# Patient Record
Sex: Female | Born: 1965
Health system: Southern US, Community
[De-identification: ages and names within clinical notes are randomized; demographics above are authoritative.]

## PROBLEM LIST (undated history)

## (undated) DIAGNOSIS — R232 Flushing: Secondary | ICD-10-CM

## (undated) DIAGNOSIS — Z79890 Hormone replacement therapy (postmenopausal): Secondary | ICD-10-CM

## (undated) DIAGNOSIS — D219 Benign neoplasm of connective and other soft tissue, unspecified: Secondary | ICD-10-CM

## (undated) DIAGNOSIS — I10 Essential (primary) hypertension: Secondary | ICD-10-CM

## (undated) DIAGNOSIS — R9389 Abnormal findings on diagnostic imaging of other specified body structures: Secondary | ICD-10-CM

## (undated) DIAGNOSIS — E669 Obesity, unspecified: Secondary | ICD-10-CM

## (undated) DIAGNOSIS — N83209 Unspecified ovarian cyst, unspecified side: Secondary | ICD-10-CM

## (undated) DIAGNOSIS — N951 Menopausal and female climacteric states: Secondary | ICD-10-CM

## (undated) HISTORY — DX: Flushing: R23.2

## (undated) HISTORY — DX: Essential (primary) hypertension: I10

## (undated) HISTORY — DX: Obesity, unspecified: E66.9

## (undated) HISTORY — PX: TUBAL LIGATION: SHX77

## (undated) HISTORY — DX: Menopausal and female climacteric states: N95.1

## (undated) HISTORY — DX: Benign neoplasm of connective and other soft tissue, unspecified: D21.9

## (undated) HISTORY — DX: Abnormal findings on diagnostic imaging of other specified body structures: R93.89

## (undated) HISTORY — DX: Hormone replacement therapy: Z79.890

## (undated) HISTORY — DX: Unspecified ovarian cyst, unspecified side: N83.209

## (undated) HISTORY — PX: BREAST BIOPSY: SHX20

---

## 2014-07-15 ENCOUNTER — Other Ambulatory Visit: Payer: Self-pay | Admitting: Adult Health

## 2014-07-15 ENCOUNTER — Encounter: Payer: Self-pay | Admitting: Adult Health

## 2014-07-15 ENCOUNTER — Other Ambulatory Visit (HOSPITAL_COMMUNITY)
Admission: RE | Admit: 2014-07-15 | Discharge: 2014-07-15 | Disposition: A | Discharge status: Home / Self Care | Payer: BC Managed Care – PPO | Source: Ambulatory Visit | Attending: Adult Health | Admitting: Adult Health

## 2014-07-15 ENCOUNTER — Ambulatory Visit (INDEPENDENT_AMBULATORY_CARE_PROVIDER_SITE_OTHER): Payer: BC Managed Care – PPO | Admitting: Adult Health

## 2014-07-15 VITALS — BP 150/100 | HR 80 | Ht 66.0 in | Wt 257.0 lb

## 2014-07-15 DIAGNOSIS — Z01419 Encounter for gynecological examination (general) (routine) without abnormal findings: Secondary | ICD-10-CM

## 2014-07-15 DIAGNOSIS — N951 Menopausal and female climacteric states: Secondary | ICD-10-CM | POA: Insufficient documentation

## 2014-07-15 DIAGNOSIS — Z1151 Encounter for screening for human papillomavirus (HPV): Secondary | ICD-10-CM | POA: Diagnosis present

## 2014-07-15 DIAGNOSIS — I10 Essential (primary) hypertension: Secondary | ICD-10-CM

## 2014-07-15 DIAGNOSIS — Z113 Encounter for screening for infections with a predominantly sexual mode of transmission: Secondary | ICD-10-CM | POA: Insufficient documentation

## 2014-07-15 DIAGNOSIS — Z1231 Encounter for screening mammogram for malignant neoplasm of breast: Secondary | ICD-10-CM

## 2014-07-15 DIAGNOSIS — R232 Flushing: Secondary | ICD-10-CM | POA: Insufficient documentation

## 2014-07-15 DIAGNOSIS — Z1212 Encounter for screening for malignant neoplasm of rectum: Secondary | ICD-10-CM

## 2014-07-15 HISTORY — DX: Essential (primary) hypertension: I10

## 2014-07-15 HISTORY — DX: Menopausal and female climacteric states: N95.1

## 2014-07-15 HISTORY — DX: Flushing: R23.2

## 2014-07-15 LAB — COMPREHENSIVE METABOLIC PANEL
ALK PHOS: 52 U/L (ref 39–117)
ALT: 26 U/L (ref 0–35)
AST: 25 U/L (ref 0–37)
Albumin: 3.8 g/dL (ref 3.5–5.2)
BILIRUBIN TOTAL: 0.3 mg/dL (ref 0.2–1.2)
BUN: 15 mg/dL (ref 6–23)
CO2: 30 meq/L (ref 19–32)
CREATININE: 0.97 mg/dL (ref 0.50–1.10)
Calcium: 9.5 mg/dL (ref 8.4–10.5)
Chloride: 102 mEq/L (ref 96–112)
Glucose, Bld: 98 mg/dL (ref 70–99)
Potassium: 4.4 mEq/L (ref 3.5–5.3)
SODIUM: 140 meq/L (ref 135–145)
TOTAL PROTEIN: 7.1 g/dL (ref 6.0–8.3)

## 2014-07-15 LAB — LIPID PANEL
CHOL/HDL RATIO: 3.5 ratio
Cholesterol: 172 mg/dL (ref 0–200)
HDL: 49 mg/dL (ref >39)
LDL Cholesterol: 106 mg/dL — ABNORMAL HIGH (ref 0–99)
Triglycerides: 87 mg/dL (ref <150)
VLDL: 17 mg/dL (ref 0–40)

## 2014-07-15 LAB — CBC
HCT: 37.3 % (ref 36.0–46.0)
Hemoglobin: 12.8 g/dL (ref 12.0–15.0)
MCH: 29 pg (ref 26.0–34.0)
MCHC: 34.3 g/dL (ref 30.0–36.0)
MCV: 84.4 fL (ref 78.0–100.0)
MPV: 10.9 fL (ref 9.4–12.4)
Platelets: 296 10*3/uL (ref 150–400)
RBC: 4.42 MIL/uL (ref 3.87–5.11)
RDW: 14.7 % (ref 11.5–15.5)
WBC: 10.6 10*3/uL — ABNORMAL HIGH (ref 4.0–10.5)

## 2014-07-15 LAB — HEMOCCULT GUIAC POC 1CARD (OFFICE): Fecal Occult Blood, POC: NEGATIVE

## 2014-07-15 MED ORDER — HYDROCHLOROTHIAZIDE 12.5 MG PO CAPS: 12.5000 mg | ORAL_CAPSULE | Freq: Every day | ORAL | Status: DC

## 2014-07-15 NOTE — Progress Notes (Signed)
Patient ID: Shelby Wright, female   DOB: 26-Apr-1966, 48 y.o.   MRN: [id number] History of Present Illness: Shelby Wright is a 48 year old black female,single,new to this practice, in for a pap and physical.It has been 5-6 years since last exam.She complains of hot flashes and periods heavy one month then light the next,still having regular.   Current Medications, Allergies, Past Medical History, Past Surgical History, Family History and Social History were reviewed in [employer] record.     Review of Systems: Patient denies any daily headaches, blurred vision, shortness of breath, chest pain, abdominal pain, problems with bowel movements, urination, or intercourse.No joint pain or mood swings.She did see some rectal bleeding last week.     Physical Exam:BP 150/100 mmHg  Pulse 80  Ht 5\' 6"  (1.676 m)  Wt 257 lb (116.574 kg)  BMI 41.50 kg/m2  LMP 06/20/2014 General:  Well developed, well nourished, no acute distress Skin:  Warm and dry Neck:  Midline trachea, normal thyroid Lungs; Clear to auscultation bilaterally Breast:  No dominant palpable mass, retraction, or nipple discharge Cardiovascular: Regular rate and rhythm Abdomen:  Soft, non tender, no hepatosplenomegaly Pelvic:  External genitalia is normal in appearance.  The vagina is normal in appearance. The cervix is bulbous and smooth,pap with HPV and GC/CHL performed.  Uterus is felt to be normal size, shape, and contour.  No  adnexal masses or tenderness noted. Rectal: Good sphincter tone, no polyps, small interanal hemorrhoids felt.  Hemoccult negative.+rectocele Extremities:  No swelling or varicosities noted Psych:  No mood changes,alert and cooperative,seems happy,she is Freight forwarder at [employer] in [place] Discussed her BP,will try hydrochlorothiazide and decreasing salt, and menopause and HRT.  Impression: Well woman exam with Pap in new patient Peri menopause Hypertension Hot flashes    Plan: Check  CBC,CMP,TSH and lipids Mammogram scheduled for her for 12/7 at 9:30 at [facility] Rx hydrochlorothiazide 12.5 mg 1 daily #30 with 11 refills Return in 2 weeks to check BP and talk options for hot flashes Physical in 1 year Colonoscopy at 33 Review handouts on HRT,perimenopause and hypertension

## 2014-07-15 NOTE — Patient Instructions (Signed)
Perimenopause Perimenopause is the time when your body begins to move into the menopause (no menstrual period for 12 straight months). It is a natural process. Perimenopause can begin 2-8 years before the menopause and usually lasts for 1 year after the menopause. During this time, your ovaries may or may not produce an egg. The ovaries vary in their production of estrogen and progesterone hormones each month. This can cause irregular menstrual periods, difficulty getting pregnant, vaginal bleeding between periods, and uncomfortable symptoms. CAUSES  Irregular production of the ovarian hormones, estrogen and progesterone, and not ovulating every month.  Other causes include:  Tumor of the pituitary gland in the brain.  Medical disease that affects the ovaries.  Radiation treatment.  Chemotherapy.  Unknown causes.  Heavy smoking and excessive alcohol intake can bring on perimenopause sooner. SIGNS AND SYMPTOMS   Hot flashes.  Night sweats.  Irregular menstrual periods.  Decreased sex drive.  Vaginal dryness.  Headaches.  Mood swings.  Depression.  Memory problems.  Irritability.  Tiredness.  Weight gain.  Trouble getting pregnant.  The beginning of losing bone cells (osteoporosis).  The beginning of hardening of the arteries (atherosclerosis). DIAGNOSIS  Your health care provider will make a diagnosis by analyzing your age, menstrual history, and symptoms. He or she will do a physical exam and note any changes in your body, especially your female organs. Female hormone tests may or may not be helpful depending on the amount of female hormones you produce and when you produce them. However, other hormone tests may be helpful to rule out other problems. TREATMENT  In some cases, no treatment is needed. The decision on whether treatment is necessary during the perimenopause should be made by you and your health care provider based on how the symptoms are affecting you  and your lifestyle. Various treatments are available, such as:  Treating individual symptoms with a specific medicine for that symptom.  Herbal medicines that can help specific symptoms.  Counseling.  Group therapy. HOME CARE INSTRUCTIONS   Keep track of your menstrual periods (when they occur, how heavy they are, how long between periods, and how long they last) as well as your symptoms and when they started.  Only take over-the-counter or prescription medicines as directed by your health care provider.  Sleep and rest.  Exercise.  Eat a diet that contains calcium (good for your bones) and soy (acts like the estrogen hormone).  Do not smoke.  Avoid alcoholic beverages.  Take vitamin supplements as recommended by your health care provider. Taking vitamin E may help in certain cases.  Take calcium and vitamin D supplements to help prevent bone loss.  Group therapy is sometimes helpful.  Acupuncture may help in some cases. SEEK MEDICAL CARE IF:   You have questions about any symptoms you are having.  You need a referral to a specialist (gynecologist, psychiatrist, or psychologist). SEEK IMMEDIATE MEDICAL CARE IF:   You have vaginal bleeding.  Your period lasts longer than 8 days.  Your periods are recurring sooner than 21 days.  You have bleeding after intercourse.  You have severe depression.  You have pain when you urinate.  You have severe headaches.  You have vision problems. Document Released: 09/05/2004 Document Revised: 05/19/2013 Document Reviewed: 02/25/2013 [facility] Patient Information 2015 [place], [place]. This information is not intended to replace advice given to you by your health care provider. Make sure you discuss any questions you have with your health care provider. Hormone Therapy At menopause, your  body begins making less estrogen and progesterone hormones. This causes the body to stop having menstrual periods. This is because estrogen and  progesterone hormones control your periods and menstrual cycle. A lack of estrogen may cause symptoms such as:  Hot flushes (or hot flashes).  Vaginal dryness.  Dry skin.  Loss of sex drive.  Risk of bone loss (osteoporosis). When this happens, you may choose to take hormone therapy to get back the estrogen lost during menopause. When the hormone estrogen is given alone, it is usually referred to as ET (Estrogen Therapy). When the hormone progestin is combined with estrogen, it is generally called HT (Hormone Therapy). This was formerly known as hormone replacement therapy (HRT). Your caregiver can help you make a decision on what will be best for you. The decision to use HT seems to change often as new studies are done. Many studies do not agree on the benefits of hormone replacement therapy. LIKELY BENEFITS OF HT INCLUDE PROTECTION FROM:  Hot Flushes (also called hot flashes) - A hot flush is a sudden feeling of heat that spreads over the face and body. The skin may redden like a blush. It is connected with sweats and sleep disturbance. Women going through menopause may have hot flushes a few times a month or several times per day depending on the woman.  Osteoporosis (bone loss)- Estrogen helps guard against bone loss. After menopause, a woman's bones slowly lose calcium and become weak and brittle. As a result, bones are more likely to break. The hip, wrist, and spine are affected most often. Hormone therapy can help slow bone loss after menopause. Weight bearing exercise and taking calcium with vitamin D also can help prevent bone loss. There are also medications that your caregiver can prescribe that can help prevent osteoporosis.  Vaginal Dryness - Loss of estrogen causes changes in the vagina. Its lining may become thin and dry. These changes can cause pain and bleeding during sexual intercourse. Dryness can also lead to infections. This can cause burning and itching. (Vaginal estrogen  treatment can help relieve pain, itching, and dryness.)  Urinary Tract Infections are more common after menopause because of lack of estrogen. Some women also develop urinary incontinence because of low estrogen levels in the vagina and bladder.  Possible other benefits of estrogen include a positive effect on mood and short-term memory in women. RISKS AND COMPLICATIONS  Using estrogen alone without progesterone causes the lining of the uterus to grow. This increases the risk of lining of the uterus (endometrial) cancer. Your caregiver should give another hormone called progestin if you have a uterus.  Women who take combined (estrogen and progestin) HT appear to have an increased risk of breast cancer. The risk appears to be small, but increases throughout the time that HT is taken.  Combined therapy also makes the breast tissue slightly denser which makes it harder to read mammograms (breast X-rays).  Combined, estrogen and progesterone therapy can be taken together every day, in which case there may be spotting of blood. HT therapy can be taken cyclically in which case you will have menstrual periods. Cyclically means HT is taken for a set amount of days, then not taken, then this process is repeated.  HT may increase the risk of stroke, heart attack, breast cancer and forming blood clots in your leg.  Transdermal estrogen (estrogen that is absorbed through the skin with a patch or a cream) may have more positive results with:  Cholesterol.  Blood  pressure.  Blood clots. Having the following conditions may indicate you should not have HT:  Endometrial cancer.  Liver disease.  Breast cancer.  Heart disease.  History of blood clots.  Stroke. TREATMENT   If you choose to take HT and have a uterus, usually estrogen and progestin are prescribed.  Your caregiver will help you decide the best way to take the medications.  Possible ways to take estrogen  include:  Pills.  Patches.  Gels.  Sprays.  Vaginal estrogen cream, rings and tablets.  It is best to take the lowest dose possible that will help your symptoms and take them for the shortest period of time that you can.  Hormone therapy can help relieve some of the problems (symptoms) that affect women at menopause. Before making a decision about HT, talk to your caregiver about what is best for you. Be well informed and comfortable with your decisions. HOME CARE INSTRUCTIONS   Follow your caregivers advice when taking the medications.  A Pap test is done to screen for cervical cancer.  The first Pap test should be done at age 52.  Between ages 43 and 73, Pap tests are repeated every 2 years.  Beginning at age 64, you are advised to have a Pap test every 3 years as long as your past 3 Pap tests have been normal.  Some women have medical problems that increase the chance of getting cervical cancer. Talk to your caregiver about these problems. It is especially important to talk to your caregiver if a new problem develops soon after your last Pap test. In these cases, your caregiver may recommend more frequent screening and Pap tests.  The above recommendations are the same for women who have or have not gotten the vaccine for HPV (Human Papillomavirus).  If you had a hysterectomy for a problem that was not a cancer or a condition that could lead to cancer, then you no longer need Pap tests. However, even if you no longer need a Pap test, a regular exam is a good idea to make sure no other problems are starting.   If you are between ages 40 and 59, and you have had normal Pap tests going back 10 years, you no longer need Pap tests. However, even if you no longer need a Pap test, a regular exam is a good idea to make sure no other problems are starting.   If you have had past treatment for cervical cancer or a condition that could lead to cancer, you need Pap tests and screening  for cancer for at least 20 years after your treatment.  If Pap tests have been discontinued, risk factors (such as a new sexual partner) need to be re-assessed to determine if screening should be resumed.  Some women may need screenings more often if they are at high risk for cervical cancer.  Get mammograms done as per the advice of your caregiver. SEEK IMMEDIATE MEDICAL CARE IF:  You develop abnormal vaginal bleeding.  You have pain or swelling in your legs, shortness of breath, or chest pain.  You develop dizziness or headaches.  You have lumps or changes in your breasts or armpits.  You have slurred speech.  You develop weakness or numbness of your arms or legs.  You have pain, burning, or bleeding when urinating.  You develop abdominal pain. Document Released: 04/27/2003 Document Revised: 10/21/2011 Document Reviewed: 08/15/2010 [facility] Patient Information 2015 [place], [place]. This information is not intended to replace advice given  to you by your health care provider. Make sure you discuss any questions you have with your health care provider. Hypertension Hypertension, commonly called high blood pressure, is when the force of blood pumping through your arteries is too strong. Your arteries are the blood vessels that carry blood from your heart throughout your body. A blood pressure reading consists of a higher number over a lower number, such as 110/72. The higher number (systolic) is the pressure inside your arteries when your heart pumps. The lower number (diastolic) is the pressure inside your arteries when your heart relaxes. Ideally you want your blood pressure below 120/80. Hypertension forces your heart to work harder to pump blood. Your arteries may become narrow or stiff. Having hypertension puts you at risk for heart disease, stroke, and other problems.  RISK FACTORS Some risk factors for high blood pressure are controllable. Others are not.  Risk factors you  cannot control include:   Race. You may be at higher risk if you are African American.  Age. Risk increases with age.  Gender. Men are at higher risk than women before age 75 years. After age 39, women are at higher risk than men. Risk factors you can control include:  Not getting enough exercise or physical activity.  Being overweight.  Getting too much fat, sugar, calories, or salt in your diet.  Drinking too much alcohol. SIGNS AND SYMPTOMS Hypertension does not usually cause signs or symptoms. Extremely high blood pressure (hypertensive crisis) may cause headache, anxiety, shortness of breath, and nosebleed. DIAGNOSIS  To check if you have hypertension, your health care provider will measure your blood pressure while you are seated, with your arm held at the level of your heart. It should be measured at least twice using the same arm. Certain conditions can cause a difference in blood pressure between your right and left arms. A blood pressure reading that is higher than normal on one occasion does not mean that you need treatment. If one blood pressure reading is high, ask your health care provider about having it checked again. TREATMENT  Treating high blood pressure includes making lifestyle changes and possibly taking medicine. Living a healthy lifestyle can help lower high blood pressure. You may need to change some of your habits. Lifestyle changes may include:  Following the DASH diet. This diet is high in fruits, vegetables, and whole grains. It is low in salt, red meat, and added sugars.  Getting at least 2 hours of brisk physical activity every week.  Losing weight if necessary.  Not smoking.  Limiting alcoholic beverages.  Learning ways to reduce stress. If lifestyle changes are not enough to get your blood pressure under control, your health care provider may prescribe medicine. You may need to take more than one. Work closely with your health care provider to  understand the risks and benefits. HOME CARE INSTRUCTIONS  Have your blood pressure rechecked as directed by your health care provider.   Take medicines only as directed by your health care provider. Follow the directions carefully. Blood pressure medicines must be taken as prescribed. The medicine does not work as well when you skip doses. Skipping doses also puts you at risk for problems.   Do not smoke.   Monitor your blood pressure at home as directed by your health care provider. SEEK MEDICAL CARE IF:   You think you are having a reaction to medicines taken.  You have recurrent headaches or feel dizzy.  You have swelling in your  ankles.  You have trouble with your vision. SEEK IMMEDIATE MEDICAL CARE IF:  You develop a severe headache or confusion.  You have unusual weakness, numbness, or feel faint.  You have severe chest or abdominal pain.  You vomit repeatedly.  You have trouble breathing. MAKE SURE YOU:   Understand these instructions.  Will watch your condition.  Will get help right away if you are not doing well or get worse. Document Released: 07/29/2005 Document Revised: 12/13/2013 Document Reviewed: 05/21/2013 [facility] Patient Information 2015 [place], [place]. This information is not intended to replace advice given to you by your health care provider. Make sure you discuss any questions you have with your health care provider. Take BP meds Follow up in 2 weeks Mammogram 12/7 at 9:30 at [place] physical in 1 year

## 2014-07-16 LAB — TSH: TSH: 1.312 u[IU]/mL (ref 0.350–4.500)

## 2014-07-18 ENCOUNTER — Telehealth: Payer: Self-pay | Admitting: Adult Health

## 2014-07-18 ENCOUNTER — Ambulatory Visit (HOSPITAL_COMMUNITY)
Admission: RE | Admit: 2014-07-18 | Discharge: 2014-07-18 | Disposition: A | Discharge status: Home / Self Care | Payer: BC Managed Care – PPO | Source: Ambulatory Visit | Attending: Adult Health | Admitting: Adult Health

## 2014-07-18 DIAGNOSIS — R928 Other abnormal and inconclusive findings on diagnostic imaging of breast: Secondary | ICD-10-CM | POA: Insufficient documentation

## 2014-07-18 DIAGNOSIS — Z1231 Encounter for screening mammogram for malignant neoplasm of breast: Secondary | ICD-10-CM

## 2014-07-18 NOTE — Telephone Encounter (Signed)
Pt aware of labs and that they were good

## 2014-07-19 LAB — CYTOLOGY - PAP

## 2014-07-20 ENCOUNTER — Other Ambulatory Visit: Payer: Self-pay | Admitting: Adult Health

## 2014-07-20 DIAGNOSIS — R928 Other abnormal and inconclusive findings on diagnostic imaging of breast: Secondary | ICD-10-CM

## 2014-07-29 ENCOUNTER — Ambulatory Visit (INDEPENDENT_AMBULATORY_CARE_PROVIDER_SITE_OTHER): Payer: BC Managed Care – PPO | Admitting: Adult Health

## 2014-07-29 ENCOUNTER — Encounter: Payer: Self-pay | Admitting: Adult Health

## 2014-07-29 VITALS — BP 128/82 | Ht 66.0 in | Wt 256.0 lb

## 2014-07-29 DIAGNOSIS — N951 Menopausal and female climacteric states: Secondary | ICD-10-CM

## 2014-07-29 DIAGNOSIS — R232 Flushing: Secondary | ICD-10-CM

## 2014-07-29 DIAGNOSIS — I10 Essential (primary) hypertension: Secondary | ICD-10-CM

## 2014-07-29 DIAGNOSIS — Z79890 Hormone replacement therapy (postmenopausal): Secondary | ICD-10-CM

## 2014-07-29 HISTORY — DX: Hormone replacement therapy: Z79.890

## 2014-07-29 MED ORDER — ESTRADIOL-NORETHINDRONE ACET 0.05-0.14 MG/DAY TD PTTW: 1.0000 | MEDICATED_PATCH | TRANSDERMAL | Status: DC

## 2014-07-29 NOTE — Progress Notes (Signed)
Subjective:     Patient ID: Shelby Wright, female   DOB: 1966/04/16, 48 y.o.   MRN: [id number]  HPI Shelby Wright is a 48 year old black female in for BP check and to discuss hot flashes.  Review of Systems See HPI Reviewed past medical,surgical, social and family history. Reviewed medications and allergies.     Objective:   Physical Exam BP 128/82 mmHg  Ht 5\' 6"  (1.676 m)  Wt 256 lb (116.121 kg)  BMI 41.34 kg/m2  LMP 06/20/2014   Skin warm and dry.  Lungs: clear to ausculation bilaterally. Cardiovascular: regular rate and rhythm. BP much better and lost 1 lb. She is having bad hot flashes and wants something, discussed options and she wants to try HRT, will Rx Combipatch, she is aware of risks and benefits. She has F/U mammogram 09/02/14 for spot right breast.  Assessment:     Hypertension Hot flashes HRT Peri menopause    Plan:     Continue Microzide Decrease salt Increase exercise Rx Combipatch 0.05-0.14 1 patch biweekly with 11 refills Review handouts on HRT and Combipatch Follow up in 4 weeks

## 2014-07-29 NOTE — Patient Instructions (Signed)
Estradiol; Norethindrone skin patches What is this medicine? ESTRADIOL; NORETHINDRONE (es tra DYE ole; nor eth IN drone) contains a mixture of female hormones. This medicine helps to relieve the symptoms of menopause like hot flashes, night sweats, mood changes, and vaginal dryness and irritation. It is also used to treat women with low estrogen levels or those who have had their ovaries removed. This medicine may be used for other purposes; ask your health care provider or pharmacist if you have questions. COMMON BRAND NAME(S): CombiPatch What should I tell my health care provider before I take this medicine? They need to know if you have any of these conditions: -blood vessel disease or blood clots -breast, cervical, endometrial, or uterine cancer -diabetes -endometriosis -fibroids -gallbladder disease -heart disease or recent heart attack -high blood cholesterol -high blood pressure -high level of calcium in the blood -hysterectomy -kidney disease -liver disease -mental depression -migraine headaches -porphyria -stroke -systemic lupus erythematosus (SLE) -tobacco smoker -vaginal bleeding -an unusual or allergic reaction to estrogens, progestins, other medicines, foods, dyes, or preservatives -pregnant or trying to get pregnant -breast-feeding How should I use this medicine? This medicine is for external use only. Follow the directions on the prescription label. Use exactly as directed. Tear open the pouch, do not use scissors. Remove the stiff protective liner covering the adhesive. Try not to touch the adhesive. Apply the patch, sticky side to the skin, to an area of the lower abdomen that is clean, dry and hairless. Avoid injured, irritated, calloused, or scarred areas. Do not apply the skin patches to your breasts or around the waist area. Use a different site each time to prevent skin irritation. You should change your patch on the same days each week. Do not cut or trim the  patch. Do not stop using except on the advice of your doctor or health care professional. Talk to your pediatrician regarding the use of this medicine in children. Special care may be needed. A patient package insert for the product will be given with each prescription and refill. Read this sheet carefully each time. The sheet may change frequently. Overdosage: If you think you have taken too much of this medicine contact a poison control center or emergency room at once. NOTE: This medicine is only for you. Do not share this medicine with others. What if I miss a dose? If you forget to change your patch as scheduled, apply it as soon as possible. Remember to remove the old patch. If it is almost time to apply the next patch, skip the missed patch and get back on your normal schedule. Do not wear more than one patch at a time unless you are told to do so by your doctor or health care professional. What may interact with this medicine? Do not take this medicine with any of the following medications: -aromatase inhibitors like aminoglutethimide, anastrozole, exemestane, letrozole, testolactone This medicine may also interact with the following medications: -barbiturates, such as phenobarbital -benzodiazepines -bosentan -bromocriptine -carbamazepine -cimetidine -cyclosporine -dantrolene -grapefruit juice -griseofulvin -hydrocortisone, cortisone, or prednisolone -isoniazid (INH) -medications for diabetes -methotrexate -mineral oil -phenytoin -raloxifene -rifabutin, rifampin, or rifapentine -tamoxifen -thyroid hormones -topiramate -tricyclic antidepressants -warfarin This list may not describe all possible interactions. Give your health care provider a list of all the medicines, herbs, non-prescription drugs, or dietary supplements you use. Also tell them if you smoke, drink alcohol, or use illegal drugs. Some items may interact with your medicine. What should I watch for while using  this medicine? Visit  your health care professional for regular checks on your progress. You should have a complete check-up every 6 months. You will need a regular breast and pelvic exam. You should also discuss the need for regular mammograms with your health care professional, and follow his or her guidelines. This medicine can make your body retain fluid, making your fingers, hands, or ankles swell. Your blood pressure can go up. Contact your doctor or health care professional if you feel you are retaining fluid. If you have any reason to think you are pregnant; stop taking this medicine at once and contact your doctor or health care professional. Tobacco smoking increases the risk of getting a blood clot or having a stroke, especially if you are more than 49 years old. You are strongly advised not to smoke. If you wear contact lenses and notice visual changes, or if the lenses begin to feel uncomfortable, consult your eye care specialist. If you are going to have elective surgery, you may need to stop taking this medicine beforehand. Consult your health care professional for advice prior to scheduling the surgery. If you are going to have a MRI procedure, let your MRI technician know about the use of these patches. Some drug patches contain an aluminized backing that can become heated when exposed to MRI and may cause burns. You may need to temporarily remove the patch during the MRI procedure. You may bathe or participate in other activities while wearing your patch. If the patch pulls loose or falls off, you may reapply it if the patch is sticky enough to stay on the skin. You should reapply the patch in a different area. Otherwise use a fresh patch. What side effects may I notice from receiving this medicine? Side effects that you should report to your doctor or health care professional as soon as possible: -allergic reactions like skin rash, itching or hives, swelling of the face, lips, or  tongue -breast tissue changes or discharge -changes in vision -chest pain -confusion, trouble speaking or understanding -dark urine -general ill feeling or flu-like symptoms -light-colored stools -nausea, vomiting -pain, swelling, warmth in the leg -right upper belly pain -severe headaches -shortness of breath -sudden numbness or weakness of the face, arm or leg -trouble walking, dizziness, loss of balance or coordination -unusual vaginal bleeding -yellowing of the eyes or skin Side effects that usually do not require medical attention (report to your doctor or health care professional if they continue or are bothersome): -acne -brown spots on the face -change in appetite -change in sexual desire -depressed mood or mood swings -fluid retention and swelling -stomach cramps or bloating -unusually weak or tired -weight gain This list may not describe all possible side effects. Call your doctor for medical advice about side effects. You may report side effects to FDA at 1-800-FDA-1088. Where should I keep my medicine? Keep out of the reach of children. Store at room temperature between 15 and 30 degrees C (59 and 86 degrees F) in the sealed foil pouch. Throw away any unused medicine after 6 months or the expiration date on the package, whichever is sooner. NOTE: This sheet is a summary. It may not cover all possible information. If you have questions about this medicine, talk to your doctor, pharmacist, or health care provider.  2015, [employer]. (2008-07-14 14:04:37) Hormone Therapy At menopause, your body begins making less estrogen and progesterone hormones. This causes the body to stop having menstrual periods. This is because estrogen and progesterone hormones control your periods  and menstrual cycle. A lack of estrogen may cause symptoms such as:  Hot flushes (or hot flashes).  Vaginal dryness.  Dry skin.  Loss of sex drive.  Risk of bone loss  (osteoporosis). When this happens, you may choose to take hormone therapy to get back the estrogen lost during menopause. When the hormone estrogen is given alone, it is usually referred to as ET (Estrogen Therapy). When the hormone progestin is combined with estrogen, it is generally called HT (Hormone Therapy). This was formerly known as hormone replacement therapy (HRT). Your caregiver can help you make a decision on what will be best for you. The decision to use HT seems to change often as new studies are done. Many studies do not agree on the benefits of hormone replacement therapy. LIKELY BENEFITS OF HT INCLUDE PROTECTION FROM:  Hot Flushes (also called hot flashes) - A hot flush is a sudden feeling of heat that spreads over the face and body. The skin may redden like a blush. It is connected with sweats and sleep disturbance. Women going through menopause may have hot flushes a few times a month or several times per day depending on the woman.  Osteoporosis (bone loss)- Estrogen helps guard against bone loss. After menopause, a woman's bones slowly lose calcium and become weak and brittle. As a result, bones are more likely to break. The hip, wrist, and spine are affected most often. Hormone therapy can help slow bone loss after menopause. Weight bearing exercise and taking calcium with vitamin D also can help prevent bone loss. There are also medications that your caregiver can prescribe that can help prevent osteoporosis.  Vaginal Dryness - Loss of estrogen causes changes in the vagina. Its lining may become thin and dry. These changes can cause pain and bleeding during sexual intercourse. Dryness can also lead to infections. This can cause burning and itching. (Vaginal estrogen treatment can help relieve pain, itching, and dryness.)  Urinary Tract Infections are more common after menopause because of lack of estrogen. Some women also develop urinary incontinence because of low estrogen levels in  the vagina and bladder.  Possible other benefits of estrogen include a positive effect on mood and short-term memory in women. RISKS AND COMPLICATIONS  Using estrogen alone without progesterone causes the lining of the uterus to grow. This increases the risk of lining of the uterus (endometrial) cancer. Your caregiver should give another hormone called progestin if you have a uterus.  Women who take combined (estrogen and progestin) HT appear to have an increased risk of breast cancer. The risk appears to be small, but increases throughout the time that HT is taken.  Combined therapy also makes the breast tissue slightly denser which makes it harder to read mammograms (breast X-rays).  Combined, estrogen and progesterone therapy can be taken together every day, in which case there may be spotting of blood. HT therapy can be taken cyclically in which case you will have menstrual periods. Cyclically means HT is taken for a set amount of days, then not taken, then this process is repeated.  HT may increase the risk of stroke, heart attack, breast cancer and forming blood clots in your leg.  Transdermal estrogen (estrogen that is absorbed through the skin with a patch or a cream) may have more positive results with:  Cholesterol.  Blood pressure.  Blood clots. Having the following conditions may indicate you should not have HT:  Endometrial cancer.  Liver disease.  Breast cancer.  Heart disease.  History of blood clots.  Stroke. TREATMENT   If you choose to take HT and have a uterus, usually estrogen and progestin are prescribed.  Your caregiver will help you decide the best way to take the medications.  Possible ways to take estrogen include:  Pills.  Patches.  Gels.  Sprays.  Vaginal estrogen cream, rings and tablets.  It is best to take the lowest dose possible that will help your symptoms and take them for the shortest period of time that you can.  Hormone  therapy can help relieve some of the problems (symptoms) that affect women at menopause. Before making a decision about HT, talk to your caregiver about what is best for you. Be well informed and comfortable with your decisions. HOME CARE INSTRUCTIONS   Follow your caregivers advice when taking the medications.  A Pap test is done to screen for cervical cancer.  The first Pap test should be done at age 27.  Between ages 30 and 100, Pap tests are repeated every 2 years.  Beginning at age 16, you are advised to have a Pap test every 3 years as long as your past 3 Pap tests have been normal.  Some women have medical problems that increase the chance of getting cervical cancer. Talk to your caregiver about these problems. It is especially important to talk to your caregiver if a new problem develops soon after your last Pap test. In these cases, your caregiver may recommend more frequent screening and Pap tests.  The above recommendations are the same for women who have or have not gotten the vaccine for HPV (Human Papillomavirus).  If you had a hysterectomy for a problem that was not a cancer or a condition that could lead to cancer, then you no longer need Pap tests. However, even if you no longer need a Pap test, a regular exam is a good idea to make sure no other problems are starting.   If you are between ages 56 and 63, and you have had normal Pap tests going back 10 years, you no longer need Pap tests. However, even if you no longer need a Pap test, a regular exam is a good idea to make sure no other problems are starting.   If you have had past treatment for cervical cancer or a condition that could lead to cancer, you need Pap tests and screening for cancer for at least 20 years after your treatment.  If Pap tests have been discontinued, risk factors (such as a new sexual partner) need to be re-assessed to determine if screening should be resumed.  Some women may need screenings  more often if they are at high risk for cervical cancer.  Get mammograms done as per the advice of your caregiver. SEEK IMMEDIATE MEDICAL CARE IF:  You develop abnormal vaginal bleeding.  You have pain or swelling in your legs, shortness of breath, or chest pain.  You develop dizziness or headaches.  You have lumps or changes in your breasts or armpits.  You have slurred speech.  You develop weakness or numbness of your arms or legs.  You have pain, burning, or bleeding when urinating.  You develop abdominal pain. Document Released: 04/27/2003 Document Revised: 10/21/2011 Document Reviewed: 08/15/2010 [facility] Patient Information 2015 [place], [place]. This information is not intended to replace advice given to you by your health care provider. Make sure you discuss any questions you have with your health care provider. Continue BP meds Increase activity Decrease salt Try  the patch  Follow up in 4 weeks

## 2014-08-02 ENCOUNTER — Ambulatory Visit (HOSPITAL_COMMUNITY)
Admission: RE | Admit: 2014-08-02 | Discharge: 2014-08-02 | Disposition: A | Discharge status: Home / Self Care | Payer: BC Managed Care – PPO | Source: Ambulatory Visit | Attending: Adult Health | Admitting: Adult Health

## 2014-08-02 ENCOUNTER — Other Ambulatory Visit: Payer: Self-pay | Admitting: Adult Health

## 2014-08-02 DIAGNOSIS — Z1231 Encounter for screening mammogram for malignant neoplasm of breast: Secondary | ICD-10-CM | POA: Diagnosis not present

## 2014-08-02 DIAGNOSIS — R928 Other abnormal and inconclusive findings on diagnostic imaging of breast: Secondary | ICD-10-CM

## 2014-08-02 DIAGNOSIS — R229 Localized swelling, mass and lump, unspecified: Principal | ICD-10-CM

## 2014-08-02 DIAGNOSIS — N63 Unspecified lump in breast: Secondary | ICD-10-CM | POA: Insufficient documentation

## 2014-08-02 DIAGNOSIS — N6310 Unspecified lump in the right breast, unspecified quadrant: Secondary | ICD-10-CM

## 2014-08-02 DIAGNOSIS — IMO0002 Reserved for concepts with insufficient information to code with codable children: Secondary | ICD-10-CM

## 2014-08-03 ENCOUNTER — Other Ambulatory Visit: Payer: Self-pay | Admitting: Adult Health

## 2014-08-03 DIAGNOSIS — R928 Other abnormal and inconclusive findings on diagnostic imaging of breast: Secondary | ICD-10-CM

## 2014-08-08 ENCOUNTER — Telehealth: Payer: Self-pay | Admitting: Adult Health

## 2014-08-08 NOTE — Telephone Encounter (Signed)
Spoke with pt. Pt states the Combipatch is to expensive. Pt states she needs something for hot flashes. Can you prescribe something different? Pt is having a right breast biopsy tomorrow at 9am. I advised pt you wasn't in the office today, but would be back tomorrow. Pt voiced understanding. [place]

## 2014-08-09 ENCOUNTER — Other Ambulatory Visit: Payer: Self-pay | Admitting: Adult Health

## 2014-08-09 ENCOUNTER — Ambulatory Visit (HOSPITAL_COMMUNITY)
Admission: RE | Admit: 2014-08-09 | Discharge: 2014-08-09 | Disposition: A | Discharge status: Home / Self Care | Payer: BC Managed Care – PPO | Source: Ambulatory Visit | Attending: Adult Health | Admitting: Adult Health

## 2014-08-09 ENCOUNTER — Ambulatory Visit (HOSPITAL_COMMUNITY): Payer: BC Managed Care – PPO

## 2014-08-09 DIAGNOSIS — R928 Other abnormal and inconclusive findings on diagnostic imaging of breast: Secondary | ICD-10-CM

## 2014-08-09 DIAGNOSIS — Z1239 Encounter for other screening for malignant neoplasm of breast: Secondary | ICD-10-CM | POA: Diagnosis not present

## 2014-08-09 DIAGNOSIS — N63 Unspecified lump in breast: Secondary | ICD-10-CM | POA: Diagnosis present

## 2014-08-09 MED ORDER — LIDOCAINE HCL (PF) 2 % IJ SOLN
INTRAMUSCULAR | Status: AC
  Administered 2014-08-09: 10 mL
  Filled 2014-08-09: qty 10

## 2014-08-09 NOTE — Telephone Encounter (Signed)
Pt had breast biopsy today, will await results before prescribing any more HRT, if results normal will rx then, has not gotten combipatch due to cost

## 2014-08-09 NOTE — Procedures (Signed)
Procedure Note Pre-operative diagnosis: Mass right breast 2:30  Post-operative diagnosis: Same  Procedure: ultrasound guided core biopsy of mass right breast 2:30  Physician:Lain Tetterton Owens Shark, M.D. Assistant: None Anesthesia: local  EBL: Minimal Findings: Preprocedure ultrasound confirms mass Specimens:Core samples from the right breast 2:30 oc mass Disposition of the specimen:Tissue to pathology. Complications: None immediately apparent. Disposition of the patient:to home in good condition.  Initial ultrasound is performed, confirming the previous findings.  Following a discussion of risks and benefits, informed consent was obtained.   Using ultrasound guidance, a 12 gauge vacuum assisted needle, sterile technique, and 1-2% lidocaine local anesthetic, biopsy is performed of the mass in the 2:30 location. The patient tolerated the procedure well.  She is given post-procedure instructions, and and is discharged to home in good condition.

## 2014-08-09 NOTE — Discharge Instructions (Signed)
Breast Biopsy °Care After °These instructions give you information on caring for yourself after your procedure. Your doctor may also give you more specific instructions. Call your doctor if you have any problems or questions after your procedure. °HOME CARE °· Only take medicine as told by your doctor. °· Do not take aspirin. °· Keep your sutures (stitches) dry when bathing. °· Protect the biopsy area. Do not let the area get bumped. °· Avoid activities that could pull the biopsy site open until your doctor approves. This includes: °· Stretching. °· Reaching. °· Exercise. °· Sports. °· Lifting more than 3lb. °· Continue your normal diet. °· Wear a good support bra for as long as told by your doctor. °· Change any bandages (dressings) as told by your doctor. °· Do not drink alcohol while taking pain medicine. °· Keep all doctor visits as told. Ask when your test results will be ready. Make sure you get your test results. °GET HELP RIGHT AWAY IF:  °· You have a fever. °· You have more bleeding (more than a small spot) from the biopsy site. °· You have trouble breathing. °· You have yellowish-white fluid (pus) coming from the biopsy site. °· You have redness, puffiness (swelling), or more pain in the biopsy site. °· You have a bad smell coming from the biopsy site. °· Your biopsy site opens after sutures, staples, or sticky strips have been removed. °· You have a rash. °· You need stronger medicine. °MAKE SURE YOU: °· Understand these instructions. °· Will watch your condition. °· Will get help right away if you are not doing well or get worse. °Document Released: 05/25/2009 Document Revised: 10/21/2011 Document Reviewed: 09/08/2011 °[employer]® Patient Information ©2015 [employer]. This information is not intended to replace advice given to you by your health care provider. Make sure you discuss any questions you have with your health care provider. ° °Breast Biopsy °A breast biopsy is a test during which a sample of  tissue is taken from your breast. The breast tissue is looked at under a microscope for cancer cells.  °BEFORE THE PROCEDURE °· Make plans to have someone drive you home after the test. °· Do not smoke for 2 weeks before the test. Stop smoking, if you smoke. °· Do not drink alcohol for 24 hours before the test. °· Wear a good support bra to the test. °PROCEDURE  °You may be given one of the following: °· A medicine to numb the breast area (local anesthetic). °· A medicine to make you fall asleep (general anesthetic). °There are different types of breast biopsies. They include: °· Fine-needle aspiration. °¨ A needle is put into the breast lump. °¨ The needle takes out fluid and cells from the lump. °¨ Ultrasound imaging may be used to help find the lump and to put the needle in the right spot. °· Core-needle biopsy. °¨ A needle is put into the breast lump. °¨ The needle is put in your breast 3-6 times. °¨ The needle removes breast tissue. °¨ An ultrasound image or X-ray is often used to find the right spot to put in the needle. °· Stereotactic biopsy. °¨ X-rays and a computer are used to study X-ray pictures of the breast lump. °¨ The computer finds where the needle needs to be put into the breast. °¨ Tissue samples are taken out. °· Vacuum-assisted biopsy. °¨ A small cut (incision) is made in your breast. °¨ A biopsy device is put through the cut and into the breast   tissue. °¨ The biopsy device draws abnormal breast tissue into the biopsy device. °¨ A large tissue sample is often removed. °¨ No stitches are needed. °· Ultrasound-guided core-needle biopsy. °¨ Ultrasound imaging helps guide the needle into the area of the breast that is not normal. °¨ A cut is made in the breast. The needle is put into the breast lump. °¨ Tissue samples are taken out. °· Open biopsy. °¨ A large cut is made in the breast. °¨ Your doctor will try to remove the whole breast lump or as much as possible. °All tissue, fluid, or cell samples  are looked at under a microscope.  °AFTER THE PROCEDURE °· You will be taken to an area to recover. You will be able to go home once you are doing well and are without problems. °· You may have bruising on your breast. This is normal. °· A pressure bandage (dressing) may be put on your breast for 24-48 hours. This type of bandage is wrapped tightly around your chest. It helps stop fluid from building up underneath tissues. °Document Released: 10/21/2011 Document Revised: 12/13/2013 Document Reviewed: 10/21/2011 °ExitCare® Patient Information ©2015 [employer]. This information is not intended to replace advice given to you by your health care provider. Make sure you discuss any questions you have with your health care provider. ° °

## 2014-08-11 ENCOUNTER — Telehealth: Payer: Self-pay | Admitting: Adult Health

## 2014-08-11 MED ORDER — ESTRADIOL-NORETHINDRONE ACET 1-0.5 MG PO TABS: 1.0000 | ORAL_TABLET | Freq: Every day | ORAL | Status: DC

## 2014-08-11 NOTE — Telephone Encounter (Signed)
Will rx activella for hot flashes pt aware of breast biopsy results

## 2014-08-25 ENCOUNTER — Ambulatory Visit (INDEPENDENT_AMBULATORY_CARE_PROVIDER_SITE_OTHER): Payer: BLUE CROSS/BLUE SHIELD | Admitting: Adult Health

## 2014-08-25 ENCOUNTER — Encounter: Payer: Self-pay | Admitting: Adult Health

## 2014-08-25 VITALS — BP 140/70 | Ht 66.0 in | Wt 258.0 lb

## 2014-08-25 DIAGNOSIS — R232 Flushing: Secondary | ICD-10-CM

## 2014-08-25 DIAGNOSIS — N951 Menopausal and female climacteric states: Secondary | ICD-10-CM

## 2014-08-25 DIAGNOSIS — I10 Essential (primary) hypertension: Secondary | ICD-10-CM

## 2014-08-25 NOTE — Progress Notes (Signed)
Subjective:     Patient ID: Shelby Wright, female   DOB: 1965/12/01, 49 y.o.   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  Review of Systems See HPI Reviewed past medical,surgical, social and family history. Reviewed medications and allergies.     Objective:   Physical Exam BP 140/70 mmHg  Ht 5\' 6"  (1.676 m)  Wt 258 lb (117.028 kg)  BMI 41.66 kg/m2  LMP 07/09/2014   Skin: Warm and dry Lungs: Clear to auscultation bilaterally. Cardiovascular: Regular rate and rhythm. Will just see how hot flashes go for now, discussed SSRI as other option, or can not take anything. Discussed try to lose about 25 lbs to help with BP.  Assessment:     Hypertension Peri menopausal Hot flashes    Plan:     Continue microzide Try Whole 30, for weight loss and health Return in 3 months for BP check and weight check

## 2014-08-25 NOTE — Patient Instructions (Signed)
Try Whole 30 Continue BP meds  Return in 3 months

## 2015-02-03 ENCOUNTER — Other Ambulatory Visit: Payer: Self-pay | Admitting: Preventative Medicine

## 2015-02-03 DIAGNOSIS — M545 Low back pain: Secondary | ICD-10-CM

## 2015-02-08 ENCOUNTER — Ambulatory Visit
Admission: RE | Admit: 2015-02-08 | Discharge: 2015-02-08 | Disposition: A | Discharge status: Home / Self Care | Payer: Worker's Compensation | Source: Ambulatory Visit | Attending: Preventative Medicine | Admitting: Preventative Medicine

## 2015-02-08 DIAGNOSIS — M545 Low back pain: Secondary | ICD-10-CM

## 2015-11-05 ENCOUNTER — Emergency Department (HOSPITAL_COMMUNITY)
Admission: EM | Admit: 2015-11-05 | Discharge: 2015-11-05 | Disposition: A | Discharge status: Home / Self Care | Payer: BLUE CROSS/BLUE SHIELD | Attending: Emergency Medicine | Admitting: Emergency Medicine

## 2015-11-05 ENCOUNTER — Emergency Department (HOSPITAL_COMMUNITY): Payer: BLUE CROSS/BLUE SHIELD

## 2015-11-05 ENCOUNTER — Encounter (HOSPITAL_COMMUNITY): Payer: Self-pay | Admitting: Emergency Medicine

## 2015-11-05 DIAGNOSIS — E669 Obesity, unspecified: Secondary | ICD-10-CM | POA: Diagnosis not present

## 2015-11-05 DIAGNOSIS — Y9301 Activity, walking, marching and hiking: Secondary | ICD-10-CM | POA: Diagnosis not present

## 2015-11-05 DIAGNOSIS — S90122A Contusion of left lesser toe(s) without damage to nail, initial encounter: Secondary | ICD-10-CM | POA: Diagnosis not present

## 2015-11-05 DIAGNOSIS — S99922A Unspecified injury of left foot, initial encounter: Secondary | ICD-10-CM | POA: Diagnosis present

## 2015-11-05 DIAGNOSIS — Y929 Unspecified place or not applicable: Secondary | ICD-10-CM | POA: Insufficient documentation

## 2015-11-05 DIAGNOSIS — W2203XA Walked into furniture, initial encounter: Secondary | ICD-10-CM | POA: Diagnosis not present

## 2015-11-05 DIAGNOSIS — I10 Essential (primary) hypertension: Secondary | ICD-10-CM | POA: Insufficient documentation

## 2015-11-05 DIAGNOSIS — Y999 Unspecified external cause status: Secondary | ICD-10-CM | POA: Insufficient documentation

## 2015-11-05 MED ORDER — TRAMADOL HCL 50 MG PO TABS: 50.0000 mg | ORAL_TABLET | Freq: Four times a day (QID) | ORAL | Status: DC | PRN

## 2015-11-05 MED ORDER — NAPROXEN 500 MG PO TABS: 500.0000 mg | ORAL_TABLET | Freq: Two times a day (BID) | ORAL | Status: DC

## 2015-11-05 NOTE — ED Notes (Signed)
Pt states she was speedwalking and "jammed" her left 2nd toe a week ago.

## 2015-11-05 NOTE — ED Provider Notes (Signed)
CSN: [id number]     Arrival date & time 11/05/15  1531 History  By signing my name below, I, Shelby Wright, attest that this documentation has been prepared under the direction and in the presence of [name], PA-C. Electronically Signed: [name], ED Scribe. 11/05/2015. 4:56 PM.     Chief Complaint  Patient presents with  . Toe Pain   The history is provided by the patient. No language interpreter was used.   HPI Comments: Shelby Wright is a 50 y.o. female who presents to the Emergency Department complaining of moderate, throbbing  left 4th toe pain and swelling onset a week ago s/p injury. Pt states she accidentally walked into her couch, causing her injury. Denies fall, LOC or head injury. She states pain and swelling is worsened with movement, ambulation and weight bearing. She is ambulatory, but with pain. She reports no relief with ice application. Pt denies taking OTC medications at home to improve symptoms. NKDA. Denies numbness, additional injuries, redness, or open wounds.   Past Medical History  Diagnosis Date  . Obesity   . Peri-menopause 07/15/2014  . Hot flashes 07/15/2014  . Hypertension 07/15/2014  . Hormone replacement therapy (HRT) 07/29/2014   Past Surgical History  Procedure Laterality Date  . Cesarean section    . Tubal ligation    . Breast biopsy Right    Family History  Problem Relation Age of Onset  . Hypertension Mother   . Diabetes Father   . Cancer Maternal Grandmother    Social History  Substance Use Topics  . Smoking status: Never Smoker   . Smokeless tobacco: Never Used  . Alcohol Use: No   OB History    Gravida Para Term Preterm AB TAB SAB Ectopic Multiple Living   1 1        1      Review of Systems  Musculoskeletal: Positive for joint swelling and arthralgias.  Skin: Negative for wound.  Neurological: Negative for weakness and numbness.  All other systems reviewed and are negative.  Allergies  Review of patient's allergies  indicates no known allergies.  Home Medications   Prior to Admission medications   Medication Sig Start Date End Date Taking? Authorizing Provider  estradiol-norethindrone (ACTIVELLA) 1-0.5 MG per tablet Take 1 tablet by mouth daily. Patient not taking: Reported on 08/25/2014 08/11/14   Estill Dooms, NP  hydrochlorothiazide (MICROZIDE) 12.5 MG capsule Take 1 capsule (12.5 mg total) by mouth daily. 07/15/14   Estill Dooms, NP   BP 161/76 mmHg  Pulse 85  Temp(Src) 98.1 F (36.7 C) (Oral)  Resp 18  Ht 5\' 6"  (1.676 m)  Wt 260 lb (117.935 kg)  BMI 41.99 kg/m2  SpO2 100%  LMP 07/09/2014 Physical Exam  Constitutional: She is oriented to person, place, and time. She appears well-developed and well-nourished.  HENT:  Head: Normocephalic and atraumatic.  Eyes: Conjunctivae and EOM are normal. Pupils are equal, round, and reactive to light.  Neck: Normal range of motion. Neck supple.  Cardiovascular: Normal rate, regular rhythm and normal heart sounds.  Exam reveals no gallop and no friction rub.   No murmur heard. Pulmonary/Chest: Effort normal and breath sounds normal. No respiratory distress. She has no wheezes. She has no rales.  Abdominal: She exhibits no distension.  Musculoskeletal: Normal range of motion. She exhibits tenderness.  Tenderness and edema of the left 4th toe. No bony deformities or open wounds. Sensation intact distally. Capillary refill less than 3 seconds.  Able to  flex and extend toe. Pt is ambulatory.   Neurological: She is alert and oriented to person, place, and time.  Skin: Skin is warm and dry.  Psychiatric: She has a normal mood and affect. Her behavior is normal.  Nursing note and vitals reviewed.   ED Course  Procedures (including critical care time) DIAGNOSTIC STUDIES: Oxygen Saturation is 100% on RA, normal by my interpretation.    COORDINATION OF CARE: 4:53 PM Discussed treatment plan with pt at bedside which includes XR, podiatry follow  up and pt agreed to plan.   Imaging Review Dg Foot Complete Left  11/05/2015  CLINICAL DATA:  Chain second toe while speed walking, initial encounter EXAM: LEFT FOOT - COMPLETE 3+ VIEW COMPARISON:  None. FINDINGS: Hallux valgus deformity is noted. Prominent soft tissues noted adjacent to the head of the first metatarsal which may represent a gouty deposits. Regularity of the fourth proximal phalanx is noted which may be related to prior trauma and healing. No definitive fracture is seen. No other acute abnormality is noted. Calcaneal spurs are seen. IMPRESSION: Chronic appearing changes as described.  No acute abnormality noted. Electronically Signed   By: Inez Catalina M.D.   On: 11/05/2015 16:00   I have personally reviewed and evaluated these images as part of my medical decision-making.   MDM   Final diagnoses:  Contusion, toe, left, initial encounter   Focal tenderness of the fourth toe.  No fx on XR.  NV intact.  Toes buddy taped.  Post op shoe.  Podiatry referral given.    I personally performed the services described in this documentation, which was scribed in my presence. The recorded information has been reviewed and is accurate.   Kem Parkinson, PA-C 11/07/15 [id number]  Davonna Belling, MD 11/07/15 [phone]

## 2016-06-25 IMAGING — MG MM DIGITAL DIAGNOSTIC UNILAT R
4 series · 4 of 4 positions shown · non-contrast
Comparison: 07/18/2014.

CLINICAL DATA: Recall from screening mammogram.

EXAM:
DIGITAL DIAGNOSTIC  right breast MAMMOGRAM
ULTRASOUND right BREAST

[R CC (1 of 2)]
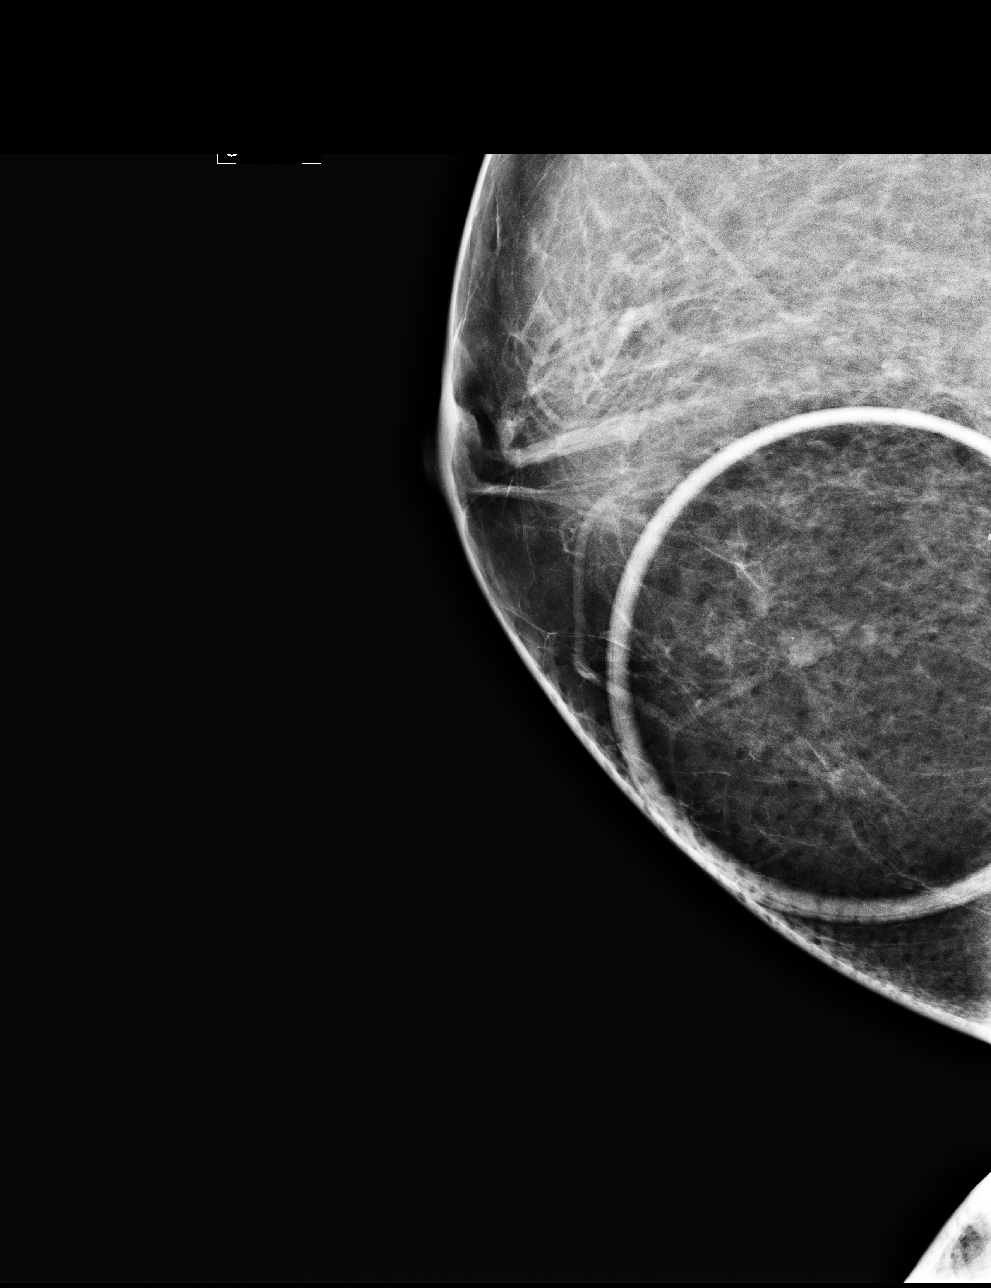

[R MLO (1 of 2)]
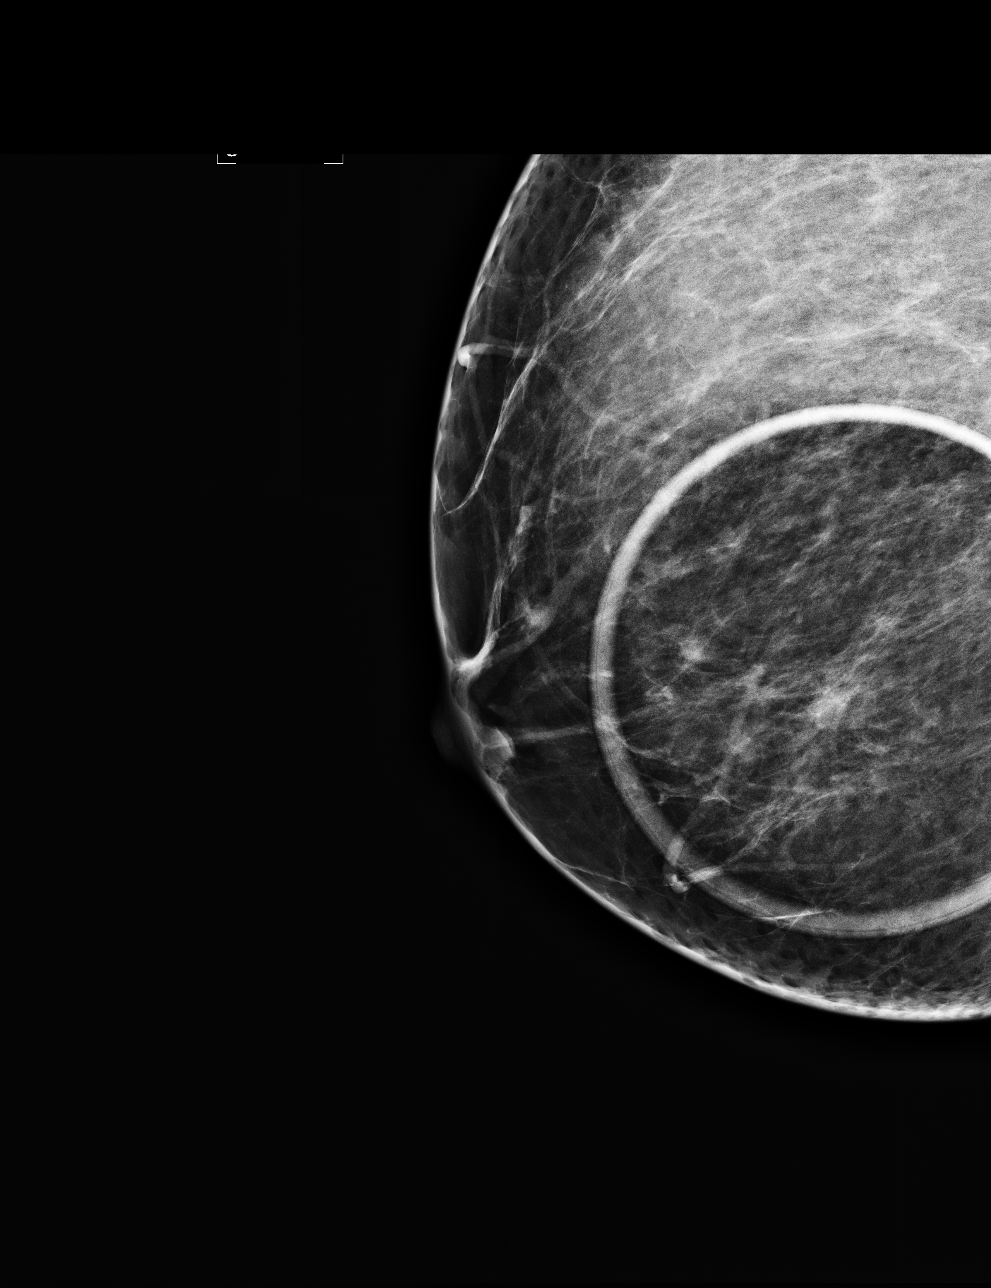

[R CC (2 of 2)]
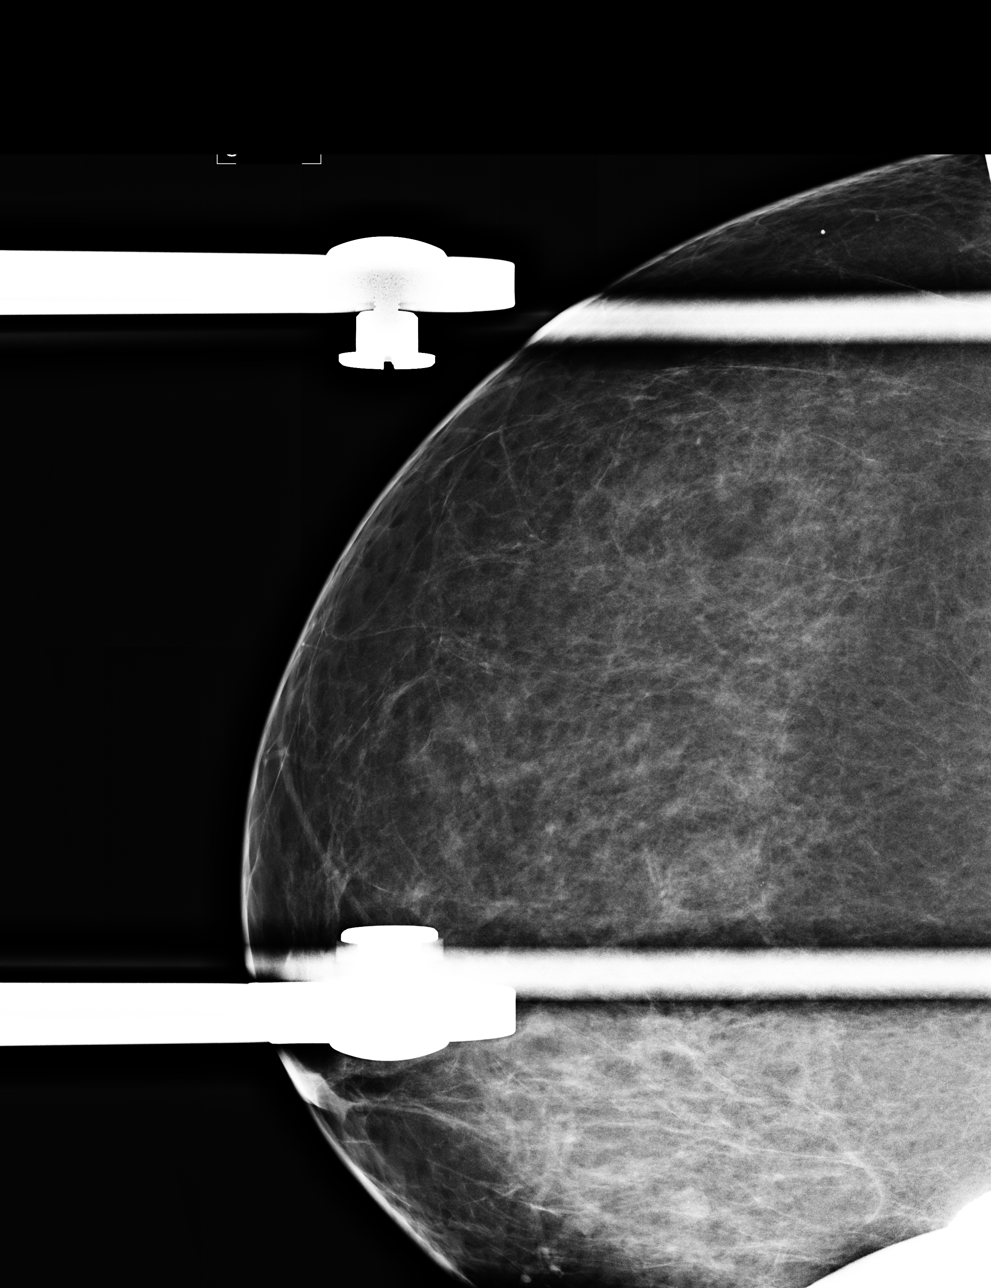

[R MLO (2 of 2)]
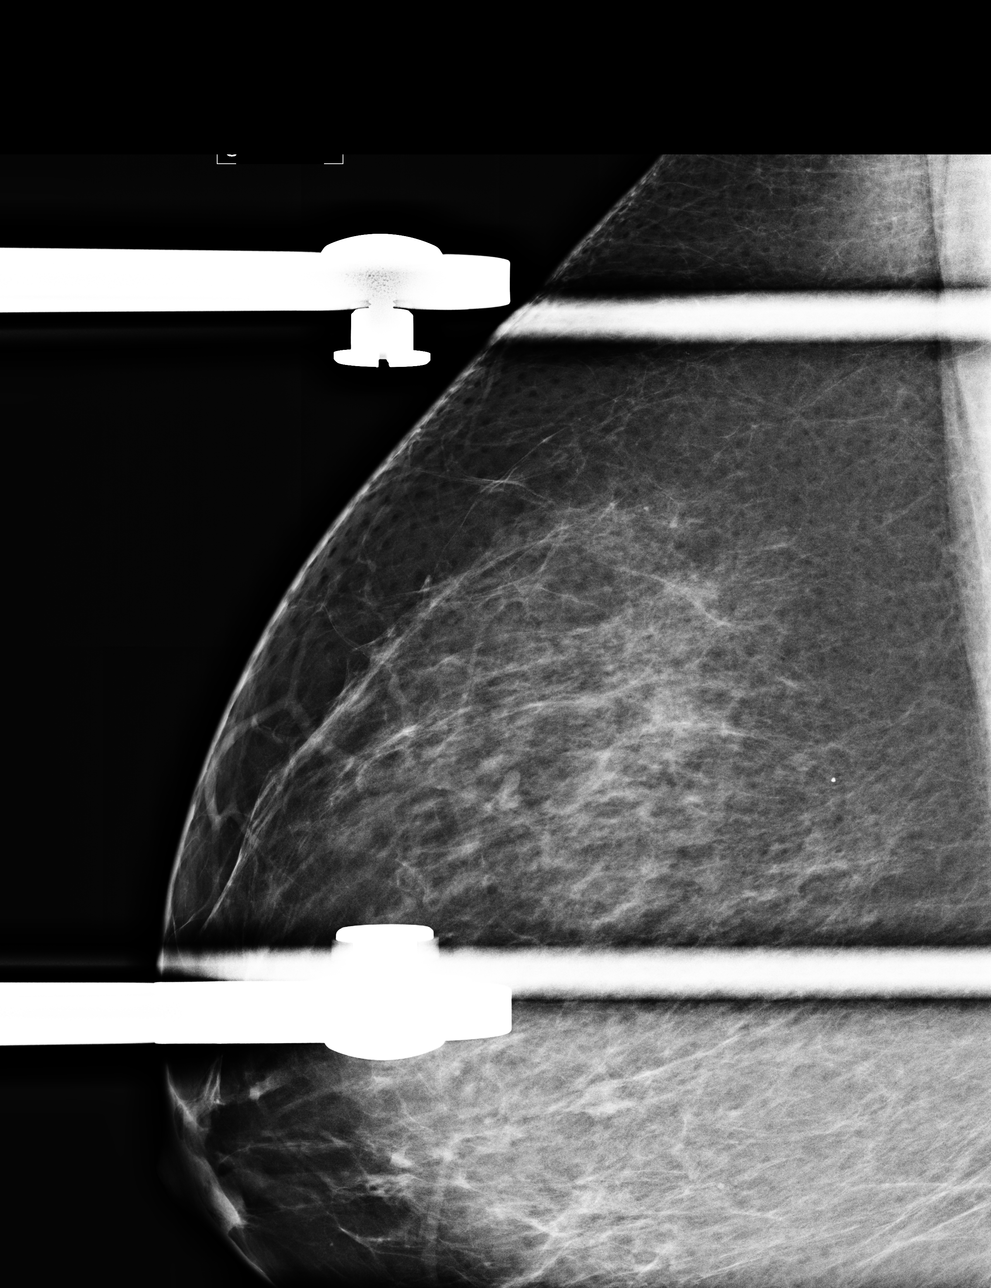

[4 of 4 positions shown; findings below may reference images not displayed]

There are no older mammograms available for
comparison.

ACR Breast Density Category b: There are scattered areas of
fibroglandular density.
FINDINGS: Spot compression views of the right breast demonstrate a slightly
irregular mass located within the medial right breast at
approximately the [DATE] to 3 o'clock position.

On physical exam, there is no discrete palpable abnormality within
the right breast.

Ultrasound is performed, showing a slightly irregular oval,
hypoechoic mass located within the right breast at the 2:30 o'clock
position 4 cm from the nipple with low-level internal echoes. There
is no color flow associated with this. This measures 8 x 7 x 5 mm in
size. This may represent a complicated cyst or fibroadenoma.
However, mammographically the margins appears slightly irregular and
tissue sampling is recommended. I have discussed ultrasound-guided
core biopsy with patient. This will be scheduled per patient
preference.

Ultrasound of the right axilla demonstrates normal axillary contents
and no evidence for adenopathy.
IMPRESSION: 8 mm slightly suspicious mass located within the right breast at the
2:30 o'clock position 4 cm from the nipple. Tissue sampling is
recommended and ultrasound-guided core biopsy will be scheduled.

RECOMMENDATION:
Right breast ultrasound-guided core biopsy.

I have discussed the findings and recommendations with the patient.
Results were also provided in writing at the conclusion of the
visit. If applicable, a reminder letter will be sent to the patient
regarding the next appointment.

BI-RADS CATEGORY  4: Suspicious.

## 2016-07-31 ENCOUNTER — Ambulatory Visit (INDEPENDENT_AMBULATORY_CARE_PROVIDER_SITE_OTHER): Payer: Self-pay | Admitting: Adult Health

## 2016-07-31 ENCOUNTER — Encounter (INDEPENDENT_AMBULATORY_CARE_PROVIDER_SITE_OTHER): Payer: Self-pay

## 2016-07-31 ENCOUNTER — Encounter: Payer: Self-pay | Admitting: Adult Health

## 2016-07-31 ENCOUNTER — Other Ambulatory Visit (HOSPITAL_COMMUNITY)
Admission: RE | Admit: 2016-07-31 | Discharge: 2016-07-31 | Disposition: A | Discharge status: Home / Self Care | Payer: BLUE CROSS/BLUE SHIELD | Source: Ambulatory Visit | Attending: Adult Health | Admitting: Adult Health

## 2016-07-31 VITALS — BP 136/80 | HR 86 | Ht 66.25 in | Wt 275.5 lb

## 2016-07-31 DIAGNOSIS — Z1151 Encounter for screening for human papillomavirus (HPV): Secondary | ICD-10-CM | POA: Insufficient documentation

## 2016-07-31 DIAGNOSIS — Z1212 Encounter for screening for malignant neoplasm of rectum: Secondary | ICD-10-CM

## 2016-07-31 DIAGNOSIS — Z1211 Encounter for screening for malignant neoplasm of colon: Secondary | ICD-10-CM

## 2016-07-31 DIAGNOSIS — Z01419 Encounter for gynecological examination (general) (routine) without abnormal findings: Secondary | ICD-10-CM | POA: Insufficient documentation

## 2016-07-31 DIAGNOSIS — Z01411 Encounter for gynecological examination (general) (routine) with abnormal findings: Secondary | ICD-10-CM

## 2016-07-31 DIAGNOSIS — N950 Postmenopausal bleeding: Secondary | ICD-10-CM

## 2016-07-31 DIAGNOSIS — Z113 Encounter for screening for infections with a predominantly sexual mode of transmission: Secondary | ICD-10-CM | POA: Insufficient documentation

## 2016-07-31 LAB — HEMOCCULT GUIAC POC 1CARD (OFFICE): Fecal Occult Blood, POC: NEGATIVE

## 2016-07-31 NOTE — Progress Notes (Signed)
Patient ID: Shelby Wright, female   DOB: [birth year], 50 y.o.   MRN: [id number] History of Present Illness: Carmello is a 50 year old black female in for well woman gyn exam and pap, she is currently self pay but will get insurance in January working for [employer].She stoppe dperiods at 47 and had bleeding 12/2-12/14.   Current Medications, Allergies, Past Medical History, Past Surgical History, Family History and Social History were reviewed in [employer] record.     Review of Systems: Patient denies any headaches, hearing loss, fatigue, blurred vision, shortness of breath, chest pain, abdominal pain, problems with bowel movements, urination, or intercourse(not currently). No joint pain or mood swings.+PMB bleeding    Physical Exam:BP 136/80 (BP Location: Left Arm, Patient Position: Sitting, Cuff Size: Large)   Pulse 86   Ht 5' 6.25" (1.683 m)   Wt 275 lb 8 oz (125 kg)   LMP 07/09/2014   BMI 44.13 kg/m  General:  Well developed, well nourished, no acute distress Skin:  Warm and dry Neck:  Midline trachea, normal thyroid, good ROM, no lymphadenopathy Lungs; Clear to auscultation bilaterally Breast:  No dominant palpable mass, retraction, or nipple discharge Cardiovascular: Regular rate and rhythm Abdomen:  Soft, non tender, no hepatosplenomegaly Pelvic:  External genitalia is normal in appearance, no lesions.  The vagina is normal in appearance. Urethra has no lesions or masses. The cervix is bulbous.Pap with HPV and CG/CHL performed.  Uterus is felt to be normal size, shape, and contour.  No adnexal masses or tenderness noted.Bladder is non tender, no masses felt. Rectal: Good sphincter tone, no polyps, or hemorrhoids felt.  Hemoccult negative. Extremities/musculoskeletal:  No swelling or varicosities noted, no clubbing or cyanosis Psych:  No mood changes, alert and cooperative,seems happy PHQ 2 score 1.Will scheduled [place] in January to assess [place] when her insurance  has started.   Impression: 1. Encounter for gynecological examination with Papanicolaou smear of cervix   2. PMB (postmenopausal bleeding)   3. Screening for colorectal cancer       Plan: Return in 2 weeks for GYN [place] to assess PMB Review handout on PMB Physical in 1 year, pap in 3 if normal Mammogram in January Colonoscopy advised Will get labs in January

## 2016-07-31 NOTE — Patient Instructions (Addendum)
Postmenopausal Bleeding Postmenopausal bleeding is any bleeding a woman has after she has entered into menopause. Menopause is the end of a woman's fertile years. After menopause, a woman no longer ovulates or has menstrual periods. Postmenopausal bleeding can be caused by various things. Any type of postmenopausal bleeding, even if it appears to be a typical menstrual period, is concerning. This should be evaluated by your health care provider. Any treatment will depend on the cause of the bleeding. Follow these instructions at home: Monitor your condition for any changes. The following actions may help to alleviate any discomfort you are experiencing:  Avoid the use of tampons and douches as directed by your health care provider.  Change your pads frequently.  Get regular pelvic exams and Pap tests.  Keep all follow-up appointments for diagnostic tests as directed by your health care provider. Contact a health care provider if:  Your bleeding lasts more than 1 week.  You have abdominal pain.  You have bleeding with sexual intercourse. Get help right away if:  You have a fever, chills, headache, dizziness, muscle aches, and bleeding.  You have severe pain with bleeding.  You are passing blood clots.  You have bleeding and need more than 1 pad an hour.  You feel faint. This information is not intended to replace advice given to you by your health care provider. Make sure you discuss any questions you have with your health care provider. Document Released: 11/06/2005 Document Revised: 01/04/2016 Document Reviewed: 02/25/2013 Elsevier Interactive Patient Education  2017 [place] mammogram after January  Colonoscopy due

## 2016-08-02 LAB — CYTOLOGY - PAP
ADEQUACY: ABSENT
Chlamydia: NEGATIVE
DIAGNOSIS: NEGATIVE
HPV (WINDOPATH): NOT DETECTED
Neisseria Gonorrhea: NEGATIVE

## 2016-08-14 ENCOUNTER — Ambulatory Visit (INDEPENDENT_AMBULATORY_CARE_PROVIDER_SITE_OTHER): Payer: BLUE CROSS/BLUE SHIELD

## 2016-08-14 ENCOUNTER — Encounter: Payer: Self-pay | Admitting: Adult Health

## 2016-08-14 ENCOUNTER — Telehealth: Payer: Self-pay | Admitting: Adult Health

## 2016-08-14 DIAGNOSIS — N83202 Unspecified ovarian cyst, left side: Secondary | ICD-10-CM

## 2016-08-14 DIAGNOSIS — D259 Leiomyoma of uterus, unspecified: Secondary | ICD-10-CM

## 2016-08-14 DIAGNOSIS — N854 Malposition of uterus: Secondary | ICD-10-CM | POA: Diagnosis not present

## 2016-08-14 DIAGNOSIS — D251 Intramural leiomyoma of uterus: Secondary | ICD-10-CM | POA: Diagnosis not present

## 2016-08-14 DIAGNOSIS — N83292 Other ovarian cyst, left side: Secondary | ICD-10-CM

## 2016-08-14 DIAGNOSIS — N950 Postmenopausal bleeding: Secondary | ICD-10-CM | POA: Diagnosis not present

## 2016-08-14 DIAGNOSIS — N83209 Unspecified ovarian cyst, unspecified side: Secondary | ICD-10-CM

## 2016-08-14 DIAGNOSIS — D219 Benign neoplasm of connective and other soft tissue, unspecified: Secondary | ICD-10-CM | POA: Insufficient documentation

## 2016-08-14 DIAGNOSIS — R9389 Abnormal findings on diagnostic imaging of other specified body structures: Secondary | ICD-10-CM

## 2016-08-14 HISTORY — DX: Unspecified ovarian cyst, unspecified side: N83.209

## 2016-08-14 HISTORY — DX: Abnormal findings on diagnostic imaging of other specified body structures: R93.89

## 2016-08-14 HISTORY — DX: Benign neoplasm of connective and other soft tissue, unspecified: D21.9

## 2016-08-14 NOTE — Progress Notes (Signed)
PELVIC US TA/TV: heterogeneous anteverted uterus w/mult fibroids,largest fibroids: (#1) post intramural 2.9 x 1.6 x 2.3 cm (#2) fundal intramural fibroid w/calcifications 2.8 x 2 x 1.7 cm,thickened EEC 8.7 mm,normal right ov,two simple left ovarian cysts (#1) 2.9 x 1.9 x 2.3 cm,(#2) 2.6 x 1.4 x 2.3 cm,no free fluid,no pain during ultrasound,ov's appear mobile

## 2016-08-14 NOTE — Telephone Encounter (Signed)
Left message to call about [place].(If she calls back:US showed thickened endometrium, 8.7 mm, and Fibroids and left ovarian cysts, she will need endo biopsy)

## 2016-08-15 ENCOUNTER — Telehealth: Payer: Self-pay | Admitting: Adult Health

## 2016-08-15 NOTE — Telephone Encounter (Signed)
Pt informed of U/S results and need for endo biopsy.  Pt had me speak with her daughter as well and both pt and daughter verbalized understanding and call transferred to front office staff for an appointment to be scheduled.

## 2016-08-23 ENCOUNTER — Other Ambulatory Visit: Payer: Self-pay | Admitting: Obstetrics and Gynecology

## 2016-08-23 ENCOUNTER — Ambulatory Visit (INDEPENDENT_AMBULATORY_CARE_PROVIDER_SITE_OTHER): Payer: BLUE CROSS/BLUE SHIELD | Admitting: Obstetrics and Gynecology

## 2016-08-23 ENCOUNTER — Encounter: Payer: Self-pay | Admitting: Obstetrics and Gynecology

## 2016-08-23 DIAGNOSIS — N950 Postmenopausal bleeding: Secondary | ICD-10-CM | POA: Diagnosis not present

## 2016-08-23 DIAGNOSIS — R938 Abnormal findings on diagnostic imaging of other specified body structures: Secondary | ICD-10-CM | POA: Diagnosis not present

## 2016-08-23 DIAGNOSIS — R9389 Abnormal findings on diagnostic imaging of other specified body structures: Secondary | ICD-10-CM

## 2016-08-23 NOTE — Progress Notes (Signed)
Patient ID: Shelby Wright, female   DOB: 1966-08-03, 51 y.o.   MRN: [id number]  HPI Pt presents for evaluation of of PMB. Transvaginal US on 08/14/16 noted 8.7 mm, symmetrical, thickened endometrium, multiple uterine fibroids. Pt states her last period was 3 years ago prior to her recent heavy bleeding, which occurred 07/13/16-07/25/16 and began again 2 days ago. She states she has tried Environmental health practitioner for her associated abdominal cramping without adequate relief.   Endometrial Biopsy: Patient given informed consent, signed copy in the chart, time out was performed. Time out taken. The patient was placed in the lithotomy position and the cervix brought into view with sterile speculum.  Portio of cervix cleansed x 2 with betadine swabs.  A tenaculum was placed in the anterior lip of the cervix. The uterus was sounded for depth of 10 cm. Milex uterine Explora 3 mm was introduced to into the uterus, suction created,  and an endometrial sample was obtained. All equipment was removed and accounted for.   The patient tolerated the procedure well.   Patient given post procedure instructions.  Followup: in 10 days for results and plan.   By signing my name below, I, Hansel Feinstein, attest that this documentation has been prepared under the direction and in the presence of Jonnie Kind, MD. Electronically Signed: Hansel Feinstein, ED Scribe. 08/23/16. 10:25 AM.  I personally performed the services described in this documentation, which was SCRIBED in my presence. The recorded information has been reviewed and considered accurate. It has been edited as necessary during review. Jonnie Kind, MD

## 2016-08-30 ENCOUNTER — Telehealth: Payer: Self-pay | Admitting: Obstetrics and Gynecology

## 2016-08-30 NOTE — Telephone Encounter (Signed)
Left message advising pt biopsy was benign, no cancer or precancer noted. [place]

## 2016-09-02 ENCOUNTER — Ambulatory Visit: Payer: BLUE CROSS/BLUE SHIELD | Admitting: Obstetrics and Gynecology

## 2016-09-04 ENCOUNTER — Ambulatory Visit (INDEPENDENT_AMBULATORY_CARE_PROVIDER_SITE_OTHER): Payer: BLUE CROSS/BLUE SHIELD | Admitting: Obstetrics and Gynecology

## 2016-09-04 ENCOUNTER — Encounter: Payer: Self-pay | Admitting: Obstetrics and Gynecology

## 2016-09-04 VITALS — BP 130/92 | HR 82 | Wt 270.0 lb

## 2016-09-04 DIAGNOSIS — R938 Abnormal findings on diagnostic imaging of other specified body structures: Secondary | ICD-10-CM | POA: Diagnosis not present

## 2016-09-04 DIAGNOSIS — R9389 Abnormal findings on diagnostic imaging of other specified body structures: Secondary | ICD-10-CM

## 2016-09-04 DIAGNOSIS — N950 Postmenopausal bleeding: Secondary | ICD-10-CM | POA: Diagnosis not present

## 2016-09-04 DIAGNOSIS — N83202 Unspecified ovarian cyst, left side: Secondary | ICD-10-CM | POA: Diagnosis not present

## 2016-09-04 NOTE — Assessment & Plan Note (Signed)
rescan u/s 02/2017

## 2016-09-04 NOTE — Progress Notes (Signed)
   [facility] Visit  09/04/16            Patient name: Shelby Wright MRN [id number]  Date of birth: September 16, 1965  CC & HPI:  Shelby Wright is a 51 y.o. female presenting today for follow up on endometrial biopsy results which was completed during last visit on 08/23/16. She states her bleeding resolved soon after the biopsy. She has no complaints at this time.   ROS:  ROS  None at this time  Pertinent History Reviewed:   Reviewed: Significant for fibroids, ovarian cyst Medical         Past Medical History:  Diagnosis Date  . Fibroids 08/14/2016  . Hormone replacement therapy (HRT) 07/29/2014  . Hot flashes 07/15/2014  . Hypertension 07/15/2014  . Obesity   . Ovarian cyst 08/14/2016  . Peri-menopause 07/15/2014  . Thickened endometrium 08/14/2016   Had PMB, EEC 8.7 cm wil get endo biopsy                               Surgical Hx:    Past Surgical History:  Procedure Laterality Date  . BREAST BIOPSY Right   . CESAREAN SECTION    . TUBAL LIGATION     Medications: Reviewed & Updated - see associated section                      No current outpatient prescriptions on file.   Social History: Reviewed -  reports that she has never smoked. She has never used smokeless tobacco.  Objective Findings:  Vitals: Blood pressure (!) 130/92, pulse 82, weight 270 lb (122.5 kg), last menstrual period 07/09/2014.  Physical Examination: not indicated    Assessment & Plan:   A:  1. Post menopausal bleeding resolved, benign endometrium by biopsy  2. Small left ovarian cyst, postmenopausal  P:  1. Repeat US in 6 months of ovary cyst left  Jonnie Kind, MD     By signing my name below, I, Sonum Patel, attest that this documentation has been prepared under the direction and in the presence of Jonnie Kind, MD. Electronically Signed: Sonum Patel, Education administrator. 09/04/16. 12:03 PM.  I personally performed the services described in this documentation, which was SCRIBED in my  presence. The recorded information has been reviewed and considered accurate. It has been edited as necessary during review. Jonnie Kind, MD

## 2017-03-03 ENCOUNTER — Other Ambulatory Visit: Payer: Self-pay | Admitting: Obstetrics and Gynecology

## 2017-03-03 DIAGNOSIS — N83202 Unspecified ovarian cyst, left side: Secondary | ICD-10-CM

## 2017-03-04 ENCOUNTER — Telehealth: Payer: Self-pay | Admitting: Obstetrics and Gynecology

## 2017-03-04 ENCOUNTER — Other Ambulatory Visit: Payer: BLUE CROSS/BLUE SHIELD

## 2017-03-04 NOTE — Telephone Encounter (Signed)
Informed patient that per last visit with Dr Glo Herring, he recommended f/u U/S in 6 months for reevaluation of ovarian cyst. Patient states she has started a new job and cannot reschedule at this time until she gets insurance or the money. Will call at a later time.

## 2018-02-02 ENCOUNTER — Encounter: Payer: Self-pay | Admitting: Adult Health

## 2018-02-02 ENCOUNTER — Other Ambulatory Visit (HOSPITAL_COMMUNITY)
Admission: RE | Admit: 2018-02-02 | Discharge: 2018-02-02 | Disposition: A | Discharge status: Home / Self Care | Payer: BLUE CROSS/BLUE SHIELD | Source: Ambulatory Visit | Attending: Adult Health | Admitting: Adult Health

## 2018-02-02 ENCOUNTER — Ambulatory Visit (INDEPENDENT_AMBULATORY_CARE_PROVIDER_SITE_OTHER): Payer: BLUE CROSS/BLUE SHIELD | Admitting: Adult Health

## 2018-02-02 VITALS — BP 123/77 | HR 76 | Ht 66.0 in | Wt 268.0 lb

## 2018-02-02 DIAGNOSIS — Z01419 Encounter for gynecological examination (general) (routine) without abnormal findings: Secondary | ICD-10-CM | POA: Diagnosis not present

## 2018-02-02 DIAGNOSIS — Z13220 Encounter for screening for lipoid disorders: Secondary | ICD-10-CM | POA: Diagnosis not present

## 2018-02-02 DIAGNOSIS — Z1212 Encounter for screening for malignant neoplasm of rectum: Secondary | ICD-10-CM | POA: Diagnosis not present

## 2018-02-02 DIAGNOSIS — Z1211 Encounter for screening for malignant neoplasm of colon: Secondary | ICD-10-CM

## 2018-02-02 DIAGNOSIS — Z8742 Personal history of other diseases of the female genital tract: Secondary | ICD-10-CM | POA: Diagnosis not present

## 2018-02-02 DIAGNOSIS — Z1329 Encounter for screening for other suspected endocrine disorder: Secondary | ICD-10-CM | POA: Diagnosis not present

## 2018-02-02 LAB — HEMOCCULT GUIAC POC 1CARD (OFFICE): FECAL OCCULT BLD: NEGATIVE

## 2018-02-02 NOTE — Progress Notes (Signed)
Patient ID: Rayola Everhart, female   DOB: 09-03-1965, 52 y.o.   MRN: [id number] History of Present Illness:  Chirstine is a 52 year old black female in for a well woman gyn exam and pap. No PCP.   Current Medications, Allergies, Past Medical History, Past Surgical History, Family History and Social History were reviewed in [employer] record.     Review of Systems: Patient denies any headaches, hearing loss, fatigue, blurred vision, shortness of breath, chest pain, abdominal pain, problems with bowel movements, urination, or intercourse. No joint pain or mood swings.Some epigastric discomfort if easts spicy food.No further PMB.    Physical Exam:BP 123/77 (BP Location: Left Arm, Patient Position: Sitting, Cuff Size: Large)   Pulse 76   Ht 5\' 6"  (1.676 m)   Wt 268 lb (121.6 kg)   LMP 07/09/2014   BMI 43.26 kg/m  General:  Well developed, well nourished, no acute distress Skin:  Warm and dry Neck:  Midline trachea, normal thyroid, good ROM, no lymphadenopathy Lungs; Clear to auscultation bilaterally Breast:  No dominant palpable mass, retraction, or nipple discharge Cardiovascular: Regular rate and rhythm Abdomen:  Soft, non tender, no hepatosplenomegaly Pelvic:  External genitalia is normal in appearance, no lesions.  The vagina is normal in appearance. Urethra has no lesions or masses. The cervix is bulbous. Pap with HPV and GC/CHL performed. Uterus is felt to be normal size, shape, and contour.  No adnexal masses or tenderness noted.Bladder is non tender, no masses felt. Rectal: Good sphincter tone, no polyps, or hemorrhoids felt.  Hemoccult negative. Extremities/musculoskeletal:  No swelling or varicosities noted, no clubbing or cyanosis Psych:  No mood changes, alert and cooperative,seems happy PHQ 2 score 0.Needs F/U US for hx left ovarian cyst.   Impression: 1. Encounter for gynecological examination with Papanicolaou smear of cervix   2. Screening for  colorectal cancer   3. History of ovarian cyst   4. Screening cholesterol level   5. Screening for thyroid disorder       Plan: Check CBC,CMP,TSH and lipids Return in 1 week for GYN [place] Physical in 1 year Pap in 3 if normal Get mammogram  She says she had colonoscopy in [place] years ago  Try TUMS if eats spicy food or OTC zantac,or Prilosec

## 2018-02-05 LAB — CYTOLOGY - PAP
ADEQUACY: ABSENT
Chlamydia: NEGATIVE
DIAGNOSIS: NEGATIVE
HPV: NOT DETECTED
Neisseria Gonorrhea: NEGATIVE

## 2018-02-11 ENCOUNTER — Ambulatory Visit (INDEPENDENT_AMBULATORY_CARE_PROVIDER_SITE_OTHER): Payer: BLUE CROSS/BLUE SHIELD

## 2018-02-11 DIAGNOSIS — Z8742 Personal history of other diseases of the female genital tract: Secondary | ICD-10-CM

## 2018-02-11 NOTE — Progress Notes (Signed)
PELVIC US TA/TV: heterogeneous anteverted uterus w/mult fibroids, (#1) posterior right intramural fibroid 2.1 x 1.3 x 2 cm,(#2) calcified fundal fibroid  2.6 x1.6 x 1.6 cm,EEC 5.3 mm,normal right ovary,two left simple ovarian cysts (#1) 1.5 x 1.4 x .9 cm,(#2) 2.5 x 1.2 x 1.5 cm,mult simple nabothian cysts,no free fluid,ovaries appear mobile,no pain during ultrasound

## 2018-02-12 LAB — COMPREHENSIVE METABOLIC PANEL
A/G RATIO: 1.3 (ref 1.2–2.2)
ALT: 24 IU/L (ref 0–32)
AST: 19 IU/L (ref 0–40)
Albumin: 4.1 g/dL (ref 3.5–5.5)
Alkaline Phosphatase: 66 IU/L (ref 39–117)
BUN/Creatinine Ratio: 14 (ref 9–23)
BUN: 15 mg/dL (ref 6–24)
Bilirubin Total: 0.2 mg/dL (ref 0.0–1.2)
CALCIUM: 9.1 mg/dL (ref 8.7–10.2)
CO2: 27 mmol/L (ref 20–29)
Chloride: 103 mmol/L (ref 96–106)
Creatinine, Ser: 1.1 mg/dL — ABNORMAL HIGH (ref 0.57–1.00)
GFR calc Af Amer: 67 mL/min/{1.73_m2} (ref >59)
GFR, EST NON AFRICAN AMERICAN: 58 mL/min/{1.73_m2} — AB (ref >59)
GLOBULIN, TOTAL: 3.1 g/dL (ref 1.5–4.5)
Glucose: 108 mg/dL — ABNORMAL HIGH (ref 65–99)
POTASSIUM: 4.3 mmol/L (ref 3.5–5.2)
Sodium: 142 mmol/L (ref 134–144)
TOTAL PROTEIN: 7.2 g/dL (ref 6.0–8.5)

## 2018-02-12 LAB — LIPID PANEL
CHOLESTEROL TOTAL: 192 mg/dL (ref 100–199)
Chol/HDL Ratio: 4.5 ratio — ABNORMAL HIGH (ref 0.0–4.4)
HDL: 43 mg/dL (ref >39)
LDL Calculated: 134 mg/dL — ABNORMAL HIGH (ref 0–99)
Triglycerides: 76 mg/dL (ref 0–149)
VLDL Cholesterol Cal: 15 mg/dL (ref 5–40)

## 2018-02-12 LAB — CBC
HEMATOCRIT: 43.1 % (ref 34.0–46.6)
HEMOGLOBIN: 13.7 g/dL (ref 11.1–15.9)
MCH: 28.2 pg (ref 26.6–33.0)
MCHC: 31.8 g/dL (ref 31.5–35.7)
MCV: 89 fL (ref 79–97)
Platelets: 303 10*3/uL (ref 150–450)
RBC: 4.85 x10E6/uL (ref 3.77–5.28)
RDW: 13.9 % (ref 12.3–15.4)
WBC: 8.2 10*3/uL (ref 3.4–10.8)

## 2018-02-12 LAB — TSH: TSH: 1.86 u[IU]/mL (ref 0.450–4.500)

## 2018-02-19 ENCOUNTER — Telehealth: Payer: Self-pay | Admitting: Adult Health

## 2018-02-19 DIAGNOSIS — R739 Hyperglycemia, unspecified: Secondary | ICD-10-CM

## 2018-02-19 DIAGNOSIS — R7989 Other specified abnormal findings of blood chemistry: Secondary | ICD-10-CM

## 2018-02-19 NOTE — Telephone Encounter (Signed)
Pt aware that has small fibroids, and there is still a cyst on left ovary but stable for last 18 months, no F/U needed per Dr Elonda Husky.She is aware that creatine level 1.10 and Blood sugar is 108, recheck CMP and A1c in about a week and make sure she has had water.

## 2021-01-17 ENCOUNTER — Emergency Department (HOSPITAL_COMMUNITY)
Admission: EM | Admit: 2021-01-17 | Discharge: 2021-01-17 | Disposition: A | Discharge status: Home / Self Care | Payer: 59 | Attending: Emergency Medicine | Admitting: Emergency Medicine

## 2021-01-17 ENCOUNTER — Encounter (HOSPITAL_COMMUNITY): Payer: Self-pay

## 2021-01-17 ENCOUNTER — Other Ambulatory Visit: Payer: Self-pay

## 2021-01-17 DIAGNOSIS — R42 Dizziness and giddiness: Secondary | ICD-10-CM | POA: Diagnosis present

## 2021-01-17 DIAGNOSIS — R030 Elevated blood-pressure reading, without diagnosis of hypertension: Secondary | ICD-10-CM | POA: Diagnosis not present

## 2021-01-17 LAB — CBC WITH DIFFERENTIAL/PLATELET
Abs Immature Granulocytes: 0.03 10*3/uL (ref 0.00–0.07)
Basophils Absolute: 0 10*3/uL (ref 0.0–0.1)
Basophils Relative: 0 %
Eosinophils Absolute: 0.2 10*3/uL (ref 0.0–0.5)
Eosinophils Relative: 3 %
HCT: 41.2 % (ref 36.0–46.0)
Hemoglobin: 13 g/dL (ref 12.0–15.0)
Immature Granulocytes: 0 %
Lymphocytes Relative: 34 %
Lymphs Abs: 3.1 10*3/uL (ref 0.7–4.0)
MCH: 29 pg (ref 26.0–34.0)
MCHC: 31.6 g/dL (ref 30.0–36.0)
MCV: 91.8 fL (ref 80.0–100.0)
Monocytes Absolute: 0.5 10*3/uL (ref 0.1–1.0)
Monocytes Relative: 6 %
Neutro Abs: 5.3 10*3/uL (ref 1.7–7.7)
Neutrophils Relative %: 57 %
Platelets: 292 10*3/uL (ref 150–400)
RBC: 4.49 MIL/uL (ref 3.87–5.11)
RDW: 15.1 % (ref 11.5–15.5)
WBC: 9.2 10*3/uL (ref 4.0–10.5)
nRBC: 0 % (ref 0.0–0.2)

## 2021-01-17 LAB — BASIC METABOLIC PANEL
Anion gap: 6 (ref 5–15)
BUN: 17 mg/dL (ref 6–20)
CO2: 28 mmol/L (ref 22–32)
Calcium: 8.9 mg/dL (ref 8.9–10.3)
Chloride: 103 mmol/L (ref 98–111)
Creatinine, Ser: 0.97 mg/dL (ref 0.44–1.00)
GFR, Estimated: >60 mL/min (ref >60)
Glucose, Bld: 101 mg/dL — ABNORMAL HIGH (ref 70–99)
Potassium: 4.6 mmol/L (ref 3.5–5.1)
Sodium: 137 mmol/L (ref 135–145)

## 2021-01-17 NOTE — ED Provider Notes (Signed)
[facility] EMERGENCY DEPARTMENT Provider Note   CSN: [id number] Arrival date & time: 01/17/21  1330     History Chief Complaint  Patient presents with  . Dizziness    Shelby Wright is a 55 y.o. female with no pertinent past medical history presents today for evaluation of dizziness. Her primary concern however is hypertension. She was at work when she got dizzy.  She states this happened for about 15 minutes.  She felt like her head was spinning.  She denies any trauma.  Given that she was at work she had them check her blood pressure and it was elevated at 160/110.  The last time she had a blood pressure checked was about 3 months ago and it was normal at that point. She does endorse eating increased amounts of sodium recently stating that she has been frequently snacking on "animal skins."  She states that these are similar to pork skins. She denies any leg swelling.  She denies any stimulant use, no significant caffeine consumption. At the time of my evaluation she feels fully normal, she no longer feels dizzy.  She denies at any point having chest pain or shortness of breath.  She denies having any neck pain or headache at any point, and she does not see a chiropractor. HPI     History reviewed. No pertinent past medical history.  There are no problems to display for this patient.   History reviewed. No pertinent surgical history.   OB History   No obstetric history on file.     History reviewed. No pertinent family history.  Social History   Tobacco Use  . Smoking status: Never Smoker  . Smokeless tobacco: Never Used  Vaping Use  . Vaping Use: Never used  Substance Use Topics  . Alcohol use: Never  . Drug use: Never    Home Medications Prior to Admission medications   Not on File    Allergies    Patient has no known allergies.  Review of Systems   Review of Systems  Constitutional: Negative for chills and fever.  HENT: Negative for congestion, ear  pain, facial swelling, hearing loss, tinnitus and trouble swallowing.   Eyes: Negative for visual disturbance.  Respiratory: Negative for cough and shortness of breath.   Cardiovascular: Negative for chest pain and leg swelling.  Gastrointestinal: Negative for abdominal pain, diarrhea, nausea and vomiting.  Genitourinary: Negative for dysuria.  Musculoskeletal: Negative for back pain and neck pain.  Neurological: Positive for dizziness (Resolved.). Negative for speech difficulty, weakness, light-headedness, numbness and headaches.  Psychiatric/Behavioral: Negative for confusion.  All other systems reviewed and are negative.   Physical Exam Updated Vital Signs BP (!) 175/105 (BP Location: Left Arm)   Pulse 77   Temp 98.3 F (36.8 C) (Oral)   Resp 16   Ht 5\' 6"  (1.676 m)   Wt 97.5 kg   SpO2 98%   BMI 34.70 kg/m   Physical Exam Vitals and nursing note reviewed.  Constitutional:      General: She is not in acute distress.    Appearance: She is well-developed. She is not ill-appearing.  HENT:     Head: Normocephalic and atraumatic.  Eyes:     Conjunctiva/sclera: Conjunctivae normal.  Cardiovascular:     Rate and Rhythm: Normal rate and regular rhythm.     Pulses: Normal pulses.     Heart sounds: Normal heart sounds. No murmur heard.   Pulmonary:     Effort: Pulmonary effort is  normal. No respiratory distress.     Breath sounds: Normal breath sounds.  Chest:     Chest wall: No tenderness.  Abdominal:     General: There is no distension.     Palpations: Abdomen is soft.     Tenderness: There is no abdominal tenderness. There is no guarding.  Musculoskeletal:     Cervical back: Normal range of motion and neck supple. No rigidity or tenderness.     Right lower leg: No edema.     Left lower leg: No edema.  Skin:    General: Skin is warm and dry.  Neurological:     General: No focal deficit present.     Mental Status: She is alert.     Sensory: No sensory deficit.      Motor: No weakness.     Coordination: Coordination normal.     Gait: Gait normal.     Comments: Patient is awake and alert.  Speech is not slurred.  Facial movements are symmetric and intact bilaterally.  No nystagmus with EOMs and no feelings of dizziness. Pupils equal round reactive to light.  Normal gait without ataxia.  Coordination and strength are both intact.  Psychiatric:        Mood and Affect: Mood normal.        Behavior: Behavior normal.     ED Results / Procedures / Treatments   Labs (all labs ordered are listed, but only abnormal results are displayed) Labs Reviewed  BASIC METABOLIC PANEL - Abnormal; Notable for the following components:      Result Value   Glucose, Bld 101 (*)    All other components within normal limits  CBC WITH DIFFERENTIAL/PLATELET    EKG EKG Interpretation  Date/Time:  Wednesday January 17 2021 13:52:35 EDT Ventricular Rate:  73 PR Interval:  162 QRS Duration: 82 QT Interval:  372 QTC Calculation: 409 R Axis:   49 Text Interpretation: Normal sinus rhythm Normal ECG Confirmed by Milton Ferguson [phone]) on 01/17/2021 6:08:14 PM   Radiology No results found.  Procedures Procedures   Medications Ordered in ED Medications - No data to display  ED Course  I have reviewed the triage vital signs and the nursing notes.  Pertinent labs & imaging results that were available during my care of the patient were reviewed by me and considered in my medical decision making (see chart for details).    MDM Rules/Calculators/A&P                         Patient is a 55 year old woman who presents today for evaluation of about 15 minutes of dizziness that resolved prior to arrival. At the time of evaluation she is no longer having any symptoms and her primary concern is that when her blood pressure was checked during this episode it was found to be elevated.  She has no history of hypertension. Basic labs including CBC and BMP are obtained without  anemia, leukocytosis, evidence of kidney failure/dysfunction or acute abnormalities. Here she is afebrile not tachycardic or tachypneic and 100% on room air.  She denies any headache and currently is symptom-free. She does not see a Restaurant manager, fast food. We discussed option for CT head imaging, however without trauma and with her being at neurologic baseline suspect that this would be low yield and she declined. She does not see a chiropractor, had no ear pain, no nystagmus and no evidence of cerebellar dysfunction.  I doubt stroke, occlusion/dissection. I  suspect that her blood pressure is elevated in part due to the stress from feeling unwell with the dizziness, finding out that her blood pressure was elevated, and her recent high salt intake. She was not performing any exertion when the dizziness started that would be suggestive of a aneurysmal rupture and had no headache. Given that at this point she is completely asymptomatic without evidence of hypertensive urgency/emergency and is not dizzy I recommended PCP follow-up.  Given that when her blood pressure was checked only 3 months ago it was normal per her report I do not think that starting her on antihypertensives would be appropriate.  I recommended that she modify her diet and excluding especially decreasing sodium.  Recommended PCP follow-up in the next 1 to 2 weeks and that she check her blood pressure and record it daily at work.   Return precautions were discussed with patient who states their understanding.  At the time of discharge patient denied any unaddressed complaints or concerns.  Patient is agreeable for discharge home.  Note: Portions of this report may have been transcribed using voice recognition software. Every effort was made to ensure accuracy; however, inadvertent computerized transcription errors may be present  Final Clinical Impression(s) / ED Diagnoses Final diagnoses:  Dizziness  Elevated blood-pressure reading without  diagnosis of hypertension    Rx / DC Orders ED Discharge Orders    None       Ollen Gross 01/17/21 2130    Milton Ferguson, MD 01/18/21 1040

## 2021-01-17 NOTE — Discharge Instructions (Signed)
Today in the emergency room your blood pressure was elevated. We discussed that this is most likely a combination of stress, along with high salt intake. Please monitor your salt intake.  I have given you some information to read about this and what we call the Dash eating plan or dietary approaches to stopping hypertension. Please schedule a follow-up appointment with your primary care doctor.  I would recommend calling them tomorrow to try and get an appointment in the next week. If you develop headache, your symptoms return, you develop chest pain, or have any other new or concerning symptoms please seek additional medical care and evaluation.

## 2021-01-17 NOTE — ED Triage Notes (Signed)
Pt to er, pt states that she was at work today, states that her blood pressure has been up and down, states that she doesn't have a hx of htn, states that she is also here for dizziness, states that it was a sudden onset of dizziness, similar to her hot flashes, but worse.  Denies dizziness at this time, denies chest pain.

## 2021-01-17 NOTE — ED Provider Notes (Signed)
Emergency Medicine Provider Triage Evaluation Note  Shelby Wright , a 55 y.o. female  was evaluated in triage.  Pt complains of blood pressure issues.  She states that at work she got light headed.  She was in the dining room at the time.  She denies weakness.  She has had dizziness before from hot flashes but this is different.  She felt like her head was spinning.   At work her BP was 160/110.  Denies currently feeling dizzy.  No chest pain.  She denies any history of hypertension but states that it hasn't been checked before as far as she can remember.   Review of Systems  Positive: Light headed dizziness Negative: Chest pain, shortness of breath  Physical Exam  BP (!) 183/103 (BP Location: Right Arm)   Pulse 78   Temp 98.3 F (36.8 C) (Oral)   Resp 20   Ht 5\' 6"  (1.676 m)   Wt 97.5 kg   SpO2 99%   BMI 34.70 kg/m  Gen:   Awake, no distress   Resp:  Normal effort  MSK:   Moves extremities without difficulty  Other:  Speech is not slurred, able to ambulate with out ataxia or weakness.   Medical Decision Making  Medically screening exam initiated at 1:48 PM.  Appropriate orders placed.  Shelby Wright was informed that the remainder of the evaluation will be completed by another provider, this initial triage assessment does not replace that evaluation, and the importance of remaining in the ED until their evaluation is complete.     Lorin Glass, PA-C 01/17/21 1349    Noemi Chapel, MD 01/20/21 1500

## 2021-01-18 ENCOUNTER — Encounter: Payer: Self-pay | Admitting: Adult Health

## 2021-01-21 ENCOUNTER — Observation Stay (HOSPITAL_COMMUNITY)
Admission: EM | Admit: 2021-01-21 | Discharge: 2021-01-24 | Disposition: A | Discharge status: Home / Self Care | Payer: 59 | Attending: General Surgery | Admitting: General Surgery

## 2021-01-21 ENCOUNTER — Emergency Department (HOSPITAL_COMMUNITY): Payer: 59

## 2021-01-21 ENCOUNTER — Encounter (HOSPITAL_COMMUNITY): Payer: Self-pay | Admitting: Emergency Medicine

## 2021-01-21 ENCOUNTER — Other Ambulatory Visit: Payer: Self-pay

## 2021-01-21 DIAGNOSIS — Y900 Blood alcohol level of less than 20 mg/100 ml: Secondary | ICD-10-CM | POA: Diagnosis not present

## 2021-01-21 DIAGNOSIS — I10 Essential (primary) hypertension: Secondary | ICD-10-CM | POA: Diagnosis not present

## 2021-01-21 DIAGNOSIS — Z79899 Other long term (current) drug therapy: Secondary | ICD-10-CM | POA: Diagnosis not present

## 2021-01-21 DIAGNOSIS — R5383 Other fatigue: Secondary | ICD-10-CM | POA: Insufficient documentation

## 2021-01-21 DIAGNOSIS — R531 Weakness: Secondary | ICD-10-CM | POA: Insufficient documentation

## 2021-01-21 DIAGNOSIS — R1030 Lower abdominal pain, unspecified: Secondary | ICD-10-CM | POA: Diagnosis present

## 2021-01-21 DIAGNOSIS — Z20822 Contact with and (suspected) exposure to covid-19: Secondary | ICD-10-CM | POA: Insufficient documentation

## 2021-01-21 DIAGNOSIS — K3580 Unspecified acute appendicitis: Secondary | ICD-10-CM | POA: Diagnosis not present

## 2021-01-21 DIAGNOSIS — K3532 Acute appendicitis with perforation and localized peritonitis, without abscess: Secondary | ICD-10-CM | POA: Diagnosis present

## 2021-01-21 LAB — RAPID URINE DRUG SCREEN, HOSP PERFORMED
Amphetamines: NOT DETECTED
Barbiturates: NOT DETECTED
Benzodiazepines: NOT DETECTED
Cocaine: NOT DETECTED
Opiates: NOT DETECTED
Tetrahydrocannabinol: NOT DETECTED

## 2021-01-21 LAB — CBC WITH DIFFERENTIAL/PLATELET
Abs Immature Granulocytes: 0.02 10*3/uL (ref 0.00–0.07)
Basophils Absolute: 0 10*3/uL (ref 0.0–0.1)
Basophils Relative: 0 %
Eosinophils Absolute: 0.2 10*3/uL (ref 0.0–0.5)
Eosinophils Relative: 2 %
HCT: 42.9 % (ref 36.0–46.0)
Hemoglobin: 13.6 g/dL (ref 12.0–15.0)
Immature Granulocytes: 0 %
Lymphocytes Relative: 31 %
Lymphs Abs: 3.2 10*3/uL (ref 0.7–4.0)
MCH: 28.8 pg (ref 26.0–34.0)
MCHC: 31.7 g/dL (ref 30.0–36.0)
MCV: 90.7 fL (ref 80.0–100.0)
Monocytes Absolute: 0.7 10*3/uL (ref 0.1–1.0)
Monocytes Relative: 7 %
Neutro Abs: 6.2 10*3/uL (ref 1.7–7.7)
Neutrophils Relative %: 60 %
Platelets: 300 10*3/uL (ref 150–400)
RBC: 4.73 MIL/uL (ref 3.87–5.11)
RDW: 15.3 % (ref 11.5–15.5)
WBC: 10.4 10*3/uL (ref 4.0–10.5)
nRBC: 0 % (ref 0.0–0.2)

## 2021-01-21 LAB — COMPREHENSIVE METABOLIC PANEL
ALT: 26 U/L (ref 0–44)
AST: 22 U/L (ref 15–41)
Albumin: 3.7 g/dL (ref 3.5–5.0)
Alkaline Phosphatase: 63 U/L (ref 38–126)
Anion gap: 9 (ref 5–15)
BUN: 25 mg/dL — ABNORMAL HIGH (ref 6–20)
CO2: 29 mmol/L (ref 22–32)
Calcium: 9 mg/dL (ref 8.9–10.3)
Chloride: 97 mmol/L — ABNORMAL LOW (ref 98–111)
Creatinine, Ser: 1.53 mg/dL — ABNORMAL HIGH (ref 0.44–1.00)
GFR, Estimated: 40 mL/min — ABNORMAL LOW (ref >60)
Glucose, Bld: 214 mg/dL — ABNORMAL HIGH (ref 70–99)
Potassium: 3.6 mmol/L (ref 3.5–5.1)
Sodium: 135 mmol/L (ref 135–145)
Total Bilirubin: 0.5 mg/dL (ref 0.3–1.2)
Total Protein: 7.5 g/dL (ref 6.5–8.1)

## 2021-01-21 LAB — HIV ANTIBODY (ROUTINE TESTING W REFLEX): HIV Screen 4th Generation wRfx: NONREACTIVE

## 2021-01-21 LAB — URINALYSIS, ROUTINE W REFLEX MICROSCOPIC
Bilirubin Urine: NEGATIVE
Glucose, UA: NEGATIVE mg/dL
Hgb urine dipstick: NEGATIVE
Ketones, ur: NEGATIVE mg/dL
Leukocytes,Ua: NEGATIVE
Nitrite: NEGATIVE
Protein, ur: NEGATIVE mg/dL
Specific Gravity, Urine: 1.043 — ABNORMAL HIGH (ref 1.005–1.030)
pH: 6 (ref 5.0–8.0)

## 2021-01-21 LAB — PREGNANCY, URINE: Preg Test, Ur: NEGATIVE

## 2021-01-21 LAB — RESP PANEL BY RT-PCR (FLU A&B, COVID) ARPGX2
Influenza A by PCR: NEGATIVE
Influenza B by PCR: NEGATIVE
SARS Coronavirus 2 by RT PCR: NEGATIVE

## 2021-01-21 LAB — ETHANOL: Alcohol, Ethyl (B): <10 mg/dL (ref <10)

## 2021-01-21 LAB — LIPASE, BLOOD: Lipase: 33 U/L (ref 11–51)

## 2021-01-21 MED ORDER — SODIUM CHLORIDE 0.9 % IV SOLN
2.0000 g | INTRAVENOUS | Status: DC
  Administered 2021-01-22 – 2021-01-24 (×3): 2 g via INTRAVENOUS
  Filled 2021-01-21 (×4): qty 20

## 2021-01-21 MED ORDER — METRONIDAZOLE 500 MG/100ML IV SOLN
500.0000 mg | Freq: Once | INTRAVENOUS | Status: AC
  Administered 2021-01-21: 500 mg via INTRAVENOUS
  Filled 2021-01-21: qty 100

## 2021-01-21 MED ORDER — LOSARTAN POTASSIUM 25 MG PO TABS: 50.0000 mg | ORAL_TABLET | Freq: Every day | ORAL | Status: DC

## 2021-01-21 MED ORDER — LOSARTAN POTASSIUM 50 MG PO TABS
50.0000 mg | ORAL_TABLET | Freq: Every day | ORAL | Status: DC
  Administered 2021-01-21 – 2021-01-24 (×3): 50 mg via ORAL
  Filled 2021-01-21 (×3): qty 1

## 2021-01-21 MED ORDER — SODIUM CHLORIDE 0.9 % IV SOLN: INTRAVENOUS | Status: DC

## 2021-01-21 MED ORDER — IOHEXOL 300 MG/ML  SOLN
100.0000 mL | Freq: Once | INTRAMUSCULAR | Status: AC | PRN
  Administered 2021-01-21: 100 mL via INTRAVENOUS

## 2021-01-21 MED ORDER — TRAMADOL HCL 50 MG PO TABS: 50.0000 mg | ORAL_TABLET | Freq: Four times a day (QID) | ORAL | Status: DC | PRN

## 2021-01-21 MED ORDER — ONDANSETRON HCL 4 MG/2ML IJ SOLN: 4.0000 mg | Freq: Four times a day (QID) | INTRAMUSCULAR | Status: DC | PRN

## 2021-01-21 MED ORDER — MORPHINE SULFATE (PF) 2 MG/ML IV SOLN
2.0000 mg | INTRAVENOUS | Status: DC | PRN
  Administered 2021-01-22: 2 mg via INTRAVENOUS
  Filled 2021-01-21: qty 1

## 2021-01-21 MED ORDER — METRONIDAZOLE 500 MG/100ML IV SOLN
500.0000 mg | Freq: Three times a day (TID) | INTRAVENOUS | Status: DC
  Administered 2021-01-21 – 2021-01-24 (×8): 500 mg via INTRAVENOUS
  Filled 2021-01-21 (×8): qty 100

## 2021-01-21 MED ORDER — HYDROCHLOROTHIAZIDE 12.5 MG PO CAPS: 12.5000 mg | ORAL_CAPSULE | Freq: Every day | ORAL | Status: DC

## 2021-01-21 MED ORDER — PHENTERMINE HCL 37.5 MG PO CAPS: 37.5000 mg | ORAL_CAPSULE | Freq: Every morning | ORAL | Status: DC

## 2021-01-21 MED ORDER — DOCUSATE SODIUM 100 MG PO CAPS: 100.0000 mg | ORAL_CAPSULE | Freq: Two times a day (BID) | ORAL | Status: DC | PRN

## 2021-01-21 MED ORDER — HYDROCHLOROTHIAZIDE 12.5 MG PO CAPS
12.5000 mg | ORAL_CAPSULE | Freq: Every day | ORAL | Status: DC
  Administered 2021-01-21 – 2021-01-24 (×3): 12.5 mg via ORAL
  Filled 2021-01-21 (×3): qty 1

## 2021-01-21 MED ORDER — ONDANSETRON HCL 4 MG/2ML IJ SOLN
4.0000 mg | Freq: Once | INTRAMUSCULAR | Status: AC
  Administered 2021-01-21: 4 mg via INTRAVENOUS
  Filled 2021-01-21: qty 2

## 2021-01-21 MED ORDER — SODIUM CHLORIDE 0.9 % IV SOLN
2.0000 g | Freq: Once | INTRAVENOUS | Status: AC
  Administered 2021-01-21: 2 g via INTRAVENOUS
  Filled 2021-01-21: qty 20

## 2021-01-21 MED ORDER — SODIUM CHLORIDE 0.9 % IV BOLUS
1000.0000 mL | Freq: Once | INTRAVENOUS | Status: AC
  Administered 2021-01-21: 1000 mL via INTRAVENOUS

## 2021-01-21 MED ORDER — ONDANSETRON 4 MG PO TBDP: 4.0000 mg | ORAL_TABLET | Freq: Four times a day (QID) | ORAL | Status: DC | PRN

## 2021-01-21 MED ORDER — LOSARTAN POTASSIUM-HCTZ 50-12.5 MG PO TABS: 1.0000 | ORAL_TABLET | Freq: Every day | ORAL | Status: DC

## 2021-01-21 MED ORDER — HYDROCODONE-ACETAMINOPHEN 5-325 MG PO TABS: 1.0000 | ORAL_TABLET | ORAL | Status: DC | PRN

## 2021-01-21 NOTE — ED Triage Notes (Signed)
Pt c/o mid abd pain and weakness that started about 1 hr PTA.

## 2021-01-21 NOTE — H&P (Signed)
Subjective:   CC: acute appendicitis  HPI:  Shelby Wright is a 55 y.o. female who is consulted for evaluation of  above cc.  Symptoms were first noted several hours ago. Pain is sharp, intermittent, periumbilical, non-radiating.  Associated with nothing specific, exacerbated by nothing specific.  Still hungry.  States she has had episodic pain of lesser degree before.     Past Medical History:  has a past medical history of Fibroids (08/14/2016), Hormone replacement therapy (HRT) (07/29/2014), Hot flashes (07/15/2014), Hypertension (07/15/2014), Obesity, Ovarian cyst (08/14/2016), Peri-menopause (07/15/2014), and Thickened endometrium (08/14/2016).  Past Surgical History:  has a past surgical history that includes Cesarean section; Tubal ligation; and Breast biopsy (Right).  Family History: family history includes Cancer in her maternal grandmother; Diabetes in her father; Hypertension in her mother.  Social History:  reports that she has never smoked. She has never used smokeless tobacco. She reports that she does not drink alcohol and does not use drugs.  Current Medications:  Prior to Admission medications   Medication Sig Start Date End Date Taking? Authorizing Provider  losartan-hydrochlorothiazide (HYZAAR) 50-12.5 MG tablet Take 1 tablet by mouth daily. 01/18/21   [provider]  phentermine 37.5 MG capsule Take 37.5 mg by mouth every morning. 11/16/20   [provider]    Allergies:  Allergies as of 01/21/2021   (No Known Allergies)    ROS:  General: Denies weight loss, weight gain, fatigue, fevers, chills, and night sweats. Eyes: Denies blurry vision, double vision, eye pain, itchy eyes, and tearing. Ears: Denies hearing loss, earache, and ringing in ears. Nose: Denies sinus pain, congestion, infections, runny nose, and nosebleeds. Mouth/throat: Denies hoarseness, sore throat, bleeding gums, and difficulty swallowing. Heart: Denies chest pain, palpitations, racing  heart, irregular heartbeat, leg pain or swelling, and decreased activity tolerance. Respiratory: Denies breathing difficulty, shortness of breath, wheezing, cough, and sputum. GI: Denies change in appetite, heartburn, nausea, vomiting, constipation, diarrhea, and blood in stool. GU: Denies difficulty urinating, pain with urinating, urgency, frequency, blood in urine. Musculoskeletal: Denies joint stiffness, pain, swelling, muscle weakness. Skin: Denies rash, itching, mass, tumors, sores, and boils Neurologic: Denies headache, fainting, dizziness, seizures, numbness, and tingling. Psychiatric: Denies depression, anxiety, difficulty sleeping, and memory loss. Endocrine: Denies heat or cold intolerance, and increased thirst or urination. Blood/lymph: Denies easy bruising, easy bruising, and swollen glands     Objective:     BP (!) 120/99 (BP Location: Right Arm)   Pulse 87   Temp 98 F (36.7 C) (Oral)   Resp 18   Ht 5\' 6"  (1.676 m)   Wt 97.5 kg   LMP 07/09/2014   SpO2 93%   BMI 34.70 kg/m    Constitutional :  alert, cooperative, appears stated age, and no distress  Lymphatics/Throat:  no asymmetry, masses, or scars  Respiratory:  clear to auscultation bilaterally  Cardiovascular:  regular rate and rhythm  Gastrointestinal: Soft, no guarding, no rebound tenderness, with minor TTP periumbilical, slightly to right of umbilicus, over the tip of appendix shown on CT .   Musculoskeletal: Steady gait and movement  Skin: Cool and moist, no surgical scars  Psychiatric: Normal affect, non-agitated, not confused       LABS:  CMP Latest Ref Rng & Units 01/21/2021 01/17/2021 02/11/2018  Glucose 70 - 99 mg/dL 214(H) 101(H) 108(H)  BUN 6 - 20 mg/dL 25(H) 17 15  Creatinine 0.44 - 1.00 mg/dL 1.53(H) 0.97 1.10(H)  Sodium 135 - 145 mmol/L 135 137 142  Potassium  3.5 - 5.1 mmol/L 3.6 4.6 4.3  Chloride 98 - 111 mmol/L 97(L) 103 103  CO2 22 - 32 mmol/L 29 28 27   Calcium 8.9 - 10.3 mg/dL 9.0 8.9 9.1   Total Protein 6.5 - 8.1 g/dL 7.5 - 7.2  Total Bilirubin 0.3 - 1.2 mg/dL 0.5 - 0.2  Alkaline Phos 38 - 126 U/L 63 - 66  AST 15 - 41 U/L 22 - 19  ALT 0 - 44 U/L 26 - 24   CBC Latest Ref Rng & Units 01/21/2021 01/17/2021 02/11/2018  WBC 4.0 - 10.5 K/uL 10.4 9.2 8.2  Hemoglobin 12.0 - 15.0 g/dL 13.6 13.0 13.7  Hematocrit 36.0 - 46.0 % 42.9 41.2 43.1  Platelets 150 - 400 K/uL 300 292 303     RADS: CLINICAL DATA:  Right lower quadrant abdominal pain and weakness which began 1 hour prior to admission   EXAM: CT ABDOMEN AND PELVIS WITH CONTRAST   TECHNIQUE: Multidetector CT imaging of the abdomen and pelvis was performed using the standard protocol following bolus administration of intravenous contrast.   CONTRAST:  154mL OMNIPAQUE IOHEXOL 300 MG/ML  SOLN   COMPARISON:  None.   FINDINGS: Lower chest: Lung bases are clear. Normal heart size. No pericardial effusion.   Hepatobiliary: No worrisome focal liver lesions. Smooth liver surface contour. Normal hepatic attenuation. Partly calcified gallstone seen in the otherwise normal gallbladder. No pericholecystic fluid or inflammation. No gallbladder wall thickening. No biliary ductal dilatation or visible intraductal gallstones.   Pancreas: No pancreatic ductal dilatation or surrounding inflammatory changes.   Spleen: Normal in size. No concerning splenic lesions. Few small accessory splenules.   Adrenals/Urinary Tract: Normal adrenal glands. Kidneys are normally located with symmetric enhancementand excretion. Several small fluid attenuation cysts are present in the left kidney. Additional smaller subcentimeter hypoattenuating foci are too small to fully characterize though statistically likely benign. No suspicious renal lesion, urolithiasis or hydronephrosis. Urinary bladder is unremarkable for the degree of distention. No visible bladder calculi or debris.   Stomach/Bowel: Distal esophagus, stomach and duodenal sweep  are unremarkable. No focal small bowel wall thickening or dilatation. Few distal ileal diverticula as well as scattered pancolonic diverticula without focal diverticular inflammation to suggest an acute diverticulitis. Borderline distended in the right lower quadrant coursing towards the right pelvic sidewall with small calcification towards the appendiceal tip. While much of the appendix appears air-filled there is some hazy adjacent stranding about a borderline distended appendiceal tip containing a small fecalith. Few reactive appearing lymph nodes present in the right lower quadrant. Appendiceal tip drapes over portion of the terminal ileum as well but without significant ileal thickening. No extraluminal gas or organized collection. No bowel obstruction.   Vascular/Lymphatic: No significant vascular findings are present. Prominent, possibly reactive nodes in the right lower quadrant, as above. No pathologically enlarged abdominal or pelvic lymph nodes.   Reproductive: Anteverted uterus. 13 mm fluid attenuation cyst in the left adnexa. No concerning adnexal lesions.   Other: Some faint hazy stranding in the right lower quadrant in the vicinity of the appendiceal tip. No free abdominopelvic air or fluid. No bowel containing hernias. Small fat containing umbilical hernia.   Musculoskeletal: Multilevel degenerative changes are present in the imaged portions of the spine. Additional degenerative changes in the hips and pelvis. No acute osseous abnormality or suspicious osseous lesion.   IMPRESSION: 1. Some questionable dilatation of the appendiceal tip with very faint hazy stranding in the right lower quadrant as the tip traits over portion  of the distal ileum. Few prominent, possibly reactive nodes may be present as well. However, much of the appendix is air-filled without extraluminal gas or organized collection. Findings are equivocal for an acute or early appendicitis and  should be considered within the complete clinical context. 2. Colonic diverticulosis without evidence of acute diverticulitis. 3. Cholelithiasis without evidence of acute cholecystitis or biliary ductal dilatation. 4. No other acute or worrisome CT abnormality. 5. 13 mm fluid attenuation cyst in the left adnexa, likely benign. No follow-up imaging recommended. Note: This recommendation does not apply to premenarchal patients and to those with increased risk (genetic, family history, elevated tumor markers or other high-risk factors) of ovarian cancer. Reference: [facility] 2020 Feb; 17(2):248-254     Electronically Signed   By: Lovena Le M.D.   On: 01/21/2021 05:22   Assessment:      Acute appendicitis, very early on, but history and exam along with CT scan images personally reviewed by myself is consistent  Elevated Cr- possibly reactive due to above.   Plan:    Pt hesitant on proceeding with surgery at this time, even after explaining  antibiotic therapy alone has a chance of early recurrence and/or failure to resolve this episode.  We discussed how the surgery itself is a common procedure with overall low risk of complications, and is most definitve treatment for appendicitis.  She verbalized understanding, but still wishes to proceed with antibiotic therapy only at this time.  I stated we will continue with IV abx, clear liquid diet, and IVF for Cr level and reassess tomorrow to see if any improvement.  I specifically stated surgery may still be recommended tomorrow.  She again verbalized understanding of all risk involved and wishes to postpone surgery for now.

## 2021-01-21 NOTE — ED Provider Notes (Signed)
[facility] EMERGENCY DEPARTMENT Provider Note   CSN: [id number] Arrival date & time: 01/21/21  0157     History Chief Complaint  Patient presents with   Abdominal Pain    Shelby Wright is a 55 y.o. female.  Patient with a history of hypertension, fibroids, ovarian cyst presenting with lower abdominal pain that onset about 1 hour ago.  States pain onset while she was riding in the car with her family.  Had a normal day previous to this.  Complains of lower abdominal pain worse periumbilically to the right lower quadrant.  Pain has not moved since it first started.  There is no associated nausea or vomiting.  No diarrhea.  No vaginal bleeding or discharge.  No pain with urination or blood in the urine.  She has had similar pain in the past but never this severe.  Denies any previous abdominal surgeries.  She also feels generally weak all over.  She had no nausea or vomiting.  She had normal bowel movements today. Was seen in the ED on June 8 for a dizzy spell with elevated blood pressure.  Reports no further dizzy spells and no headache.  No focal weakness, numbness or tingling.  The history is provided by the patient.  Abdominal Pain Associated symptoms: fatigue   Associated symptoms: no chest pain, no cough, no dysuria, no fever, no hematuria, no nausea, no shortness of breath and no vomiting       Past Medical History:  Diagnosis Date   Fibroids 08/14/2016   Hormone replacement therapy (HRT) 07/29/2014   Hot flashes 07/15/2014   Hypertension 07/15/2014   Obesity    Ovarian cyst 08/14/2016   Peri-menopause 07/15/2014   Thickened endometrium 08/14/2016   Had PMB, EEC 8.7 cm wil get endo biopsy     Patient Active Problem List   Diagnosis Date Noted   Screening for colorectal cancer 02/02/2018   Encounter for gynecological examination with Papanicolaou smear of cervix 02/02/2018   History of ovarian cyst 02/02/2018   Screening for thyroid disorder 02/02/2018   Screening cholesterol  level 02/02/2018   Postmenopausal bleeding 08/23/2016   Fibroids 08/14/2016   Thickened endometrium 08/14/2016   Ovarian cyst 08/14/2016   Hormone replacement therapy (HRT) 07/29/2014   Peri-menopause 07/15/2014   Hot flashes 07/15/2014   Hypertension 07/15/2014    Past Surgical History:  Procedure Laterality Date   BREAST BIOPSY Right    CESAREAN SECTION     TUBAL LIGATION       OB History     Gravida  1   Para  1   Term  1   Preterm  0   AB  0   Living  1      SAB  0   IAB  0   Ectopic  0   Multiple      Live Births  1           Family History  Problem Relation Age of Onset   Hypertension Mother    Diabetes Father    Cancer Maternal Grandmother     Social History   Tobacco Use   Smoking status: Never   Smokeless tobacco: Never  Vaping Use   Vaping Use: Never used  Substance Use Topics   Alcohol use: Never   Drug use: Never    Home Medications Prior to Admission medications   Not on File    Allergies    Patient has no known allergies.  Review of  Systems   Review of Systems  Constitutional:  Positive for fatigue. Negative for activity change, appetite change and fever.  HENT:  Negative for congestion and rhinorrhea.   Respiratory:  Negative for cough and shortness of breath.   Cardiovascular:  Negative for chest pain.  Gastrointestinal:  Positive for abdominal pain. Negative for nausea and vomiting.  Genitourinary:  Negative for dysuria, enuresis and hematuria.  Skin:  Negative for rash.  Neurological:  Positive for weakness. Negative for dizziness and headaches.   all other systems are negative except as noted in the HPI and PMH.   Physical Exam Updated Vital Signs BP (!) 120/99 (BP Location: Right Arm)   Pulse 87   Temp 98 F (36.7 C) (Oral)   Resp 18   Ht 5\' 6"  (1.676 m)   Wt 97.5 kg   LMP 07/09/2014   SpO2 93%   BMI 34.70 kg/m   Physical Exam Vitals and nursing note reviewed.  Constitutional:      General:  She is not in acute distress.    Appearance: She is well-developed.     Comments: Fatigued appearing  HENT:     Head: Normocephalic and atraumatic.     Mouth/Throat:     Pharynx: No oropharyngeal exudate.  Eyes:     Conjunctiva/sclera: Conjunctivae normal.     Pupils: Pupils are equal, round, and reactive to light.  Neck:     Comments: No meningismus. Cardiovascular:     Rate and Rhythm: Normal rate and regular rhythm.     Heart sounds: Normal heart sounds. No murmur heard. Pulmonary:     Effort: Pulmonary effort is normal. No respiratory distress.     Breath sounds: Normal breath sounds.  Abdominal:     Palpations: Abdomen is soft.     Tenderness: There is abdominal tenderness. There is no guarding or rebound.     Comments: Periumbilical and right lower quadrant tenderness, no guarding or rebound  Musculoskeletal:        General: No tenderness. Normal range of motion.     Cervical back: Normal range of motion and neck supple.  Skin:    General: Skin is warm.  Neurological:     Mental Status: She is alert and oriented to person, place, and time.     Cranial Nerves: No cranial nerve deficit.     Motor: No abnormal muscle tone.     Coordination: Coordination normal.     Comments:  5/5 strength throughout. CN 2-12 intact.Equal grip strength.   Psychiatric:        Behavior: Behavior normal.    ED Results / Procedures / Treatments   Labs (all labs ordered are listed, but only abnormal results are displayed) Labs Reviewed  COMPREHENSIVE METABOLIC PANEL - Abnormal; Notable for the following components:      Result Value   Chloride 97 (*)    Glucose, Bld 214 (*)    BUN 25 (*)    Creatinine, Ser 1.53 (*)    GFR, Estimated 40 (*)    All other components within normal limits  CBC WITH DIFFERENTIAL/PLATELET  LIPASE, BLOOD  ETHANOL  URINALYSIS, ROUTINE W REFLEX MICROSCOPIC  PREGNANCY, URINE  RAPID URINE DRUG SCREEN, HOSP PERFORMED    EKG None  Radiology CT Head Wo  Contrast  Result Date: 01/21/2021 CLINICAL DATA:  Delirium with abdominal pain and weakness EXAM: CT HEAD WITHOUT CONTRAST TECHNIQUE: Contiguous axial images were obtained from the base of the skull through the vertex without intravenous contrast.  COMPARISON:  None. FINDINGS: Brain: No evidence of acute infarction, hemorrhage, hydrocephalus, extra-axial collection or mass lesion/mass effect. Vascular: No hyperdense vessel or unexpected calcification. Skull: Normal. Negative for fracture or focal lesion. Sinuses/Orbits: No acute finding. IMPRESSION: Negative head CT. Electronically Signed   By: Monte Fantasia M.D.   On: 01/21/2021 05:14   CT ABDOMEN PELVIS W CONTRAST  Result Date: 01/21/2021 CLINICAL DATA:  Right lower quadrant abdominal pain and weakness which began 1 hour prior to admission EXAM: CT ABDOMEN AND PELVIS WITH CONTRAST TECHNIQUE: Multidetector CT imaging of the abdomen and pelvis was performed using the standard protocol following bolus administration of intravenous contrast. CONTRAST:  125mL OMNIPAQUE IOHEXOL 300 MG/ML  SOLN COMPARISON:  None. FINDINGS: Lower chest: Lung bases are clear. Normal heart size. No pericardial effusion. Hepatobiliary: No worrisome focal liver lesions. Smooth liver surface contour. Normal hepatic attenuation. Partly calcified gallstone seen in the otherwise normal gallbladder. No pericholecystic fluid or inflammation. No gallbladder wall thickening. No biliary ductal dilatation or visible intraductal gallstones. Pancreas: No pancreatic ductal dilatation or surrounding inflammatory changes. Spleen: Normal in size. No concerning splenic lesions. Few small accessory splenules. Adrenals/Urinary Tract: Normal adrenal glands. Kidneys are normally located with symmetric enhancementand excretion. Several small fluid attenuation cysts are present in the left kidney. Additional smaller subcentimeter hypoattenuating foci are too small to fully characterize though statistically  likely benign. No suspicious renal lesion, urolithiasis or hydronephrosis. Urinary bladder is unremarkable for the degree of distention. No visible bladder calculi or debris. Stomach/Bowel: Distal esophagus, stomach and duodenal sweep are unremarkable. No focal small bowel wall thickening or dilatation. Few distal ileal diverticula as well as scattered pancolonic diverticula without focal diverticular inflammation to suggest an acute diverticulitis. Borderline distended in the right lower quadrant coursing towards the right pelvic sidewall with small calcification towards the appendiceal tip. While much of the appendix appears air-filled there is some hazy adjacent stranding about a borderline distended appendiceal tip containing a small fecalith. Few reactive appearing lymph nodes present in the right lower quadrant. Appendiceal tip drapes over portion of the terminal ileum as well but without significant ileal thickening. No extraluminal gas or organized collection. No bowel obstruction. Vascular/Lymphatic: No significant vascular findings are present. Prominent, possibly reactive nodes in the right lower quadrant, as above. No pathologically enlarged abdominal or pelvic lymph nodes. Reproductive: Anteverted uterus. 13 mm fluid attenuation cyst in the left adnexa. No concerning adnexal lesions. Other: Some faint hazy stranding in the right lower quadrant in the vicinity of the appendiceal tip. No free abdominopelvic air or fluid. No bowel containing hernias. Small fat containing umbilical hernia. Musculoskeletal: Multilevel degenerative changes are present in the imaged portions of the spine. Additional degenerative changes in the hips and pelvis. No acute osseous abnormality or suspicious osseous lesion. IMPRESSION: 1. Some questionable dilatation of the appendiceal tip with very faint hazy stranding in the right lower quadrant as the tip traits over portion of the distal ileum. Few prominent, possibly reactive  nodes may be present as well. However, much of the appendix is air-filled without extraluminal gas or organized collection. Findings are equivocal for an acute or early appendicitis and should be considered within the complete clinical context. 2. Colonic diverticulosis without evidence of acute diverticulitis. 3. Cholelithiasis without evidence of acute cholecystitis or biliary ductal dilatation. 4. No other acute or worrisome CT abnormality. 5. 13 mm fluid attenuation cyst in the left adnexa, likely benign. No follow-up imaging recommended. Note: This recommendation does not apply to premenarchal patients and  to those with increased risk (genetic, family history, elevated tumor markers or other high-risk factors) of ovarian cancer. Reference: [facility] 2020 Feb; 17(2):248-254 Electronically Signed   By: Lovena Le M.D.   On: 01/21/2021 05:22    Procedures Procedures   Medications Ordered in ED Medications  sodium chloride 0.9 % bolus 1,000 mL (has no administration in time range)  ondansetron (ZOFRAN) injection 4 mg (has no administration in time range)    ED Course  I have reviewed the triage vital signs and the nursing notes.  Pertinent labs & imaging results that were available during my care of the patient were reviewed by me and considered in my medical decision making (see chart for details).    MDM Rules/Calculators/A&P                         Lower abdominal pain with generalized weakness.  Abdomen soft without peritoneal signs.  Vitals are stable.  Labs show mild AKI with creatinine 1.5, CBG 214 with no history of diabetes  Results discussed with patient.  Advised to follow-up with her doctor regarding elevated blood sugar.  She is given IV fluids for elevated creatinine.  CT scan today shows possible early appendicitis.  She still has some some periumbilical pain.  D/w Dr. Lysle Pearl of general surgery who will plan observation admission and consult on patient later today for possible  appendectomy.  Agrees with IV antibiotics and n.p.o.  Patient informed of her elevated blood sugar and need for follow-up with her PCP regarding that. Final Clinical Impression(s) / ED Diagnoses Final diagnoses:  Acute appendicitis, unspecified acute appendicitis type    Rx / DC Orders ED Discharge Orders     None        Delmos Velaquez, Annie Main, MD 01/21/21 [phone]

## 2021-01-22 ENCOUNTER — Encounter (HOSPITAL_COMMUNITY)
Admission: EM | Disposition: A | Discharge status: Home / Self Care | Payer: Self-pay | Source: Home / Self Care | Attending: Emergency Medicine

## 2021-01-22 ENCOUNTER — Encounter (HOSPITAL_COMMUNITY): Payer: Self-pay | Admitting: Surgery

## 2021-01-22 ENCOUNTER — Observation Stay (HOSPITAL_COMMUNITY): Payer: 59 | Admitting: Anesthesiology

## 2021-01-22 HISTORY — PX: LAPAROSCOPIC APPENDECTOMY: SHX408

## 2021-01-22 LAB — CBC
HCT: 40.7 % (ref 36.0–46.0)
Hemoglobin: 12.8 g/dL (ref 12.0–15.0)
MCH: 29 pg (ref 26.0–34.0)
MCHC: 31.4 g/dL (ref 30.0–36.0)
MCV: 92.1 fL (ref 80.0–100.0)
Platelets: 298 10*3/uL (ref 150–400)
RBC: 4.42 MIL/uL (ref 3.87–5.11)
RDW: 15.4 % (ref 11.5–15.5)
WBC: 9.2 10*3/uL (ref 4.0–10.5)
nRBC: 0 % (ref 0.0–0.2)

## 2021-01-22 LAB — BASIC METABOLIC PANEL
Anion gap: 6 (ref 5–15)
BUN: 13 mg/dL (ref 6–20)
CO2: 29 mmol/L (ref 22–32)
Calcium: 8.6 mg/dL — ABNORMAL LOW (ref 8.9–10.3)
Chloride: 104 mmol/L (ref 98–111)
Creatinine, Ser: 1.18 mg/dL — ABNORMAL HIGH (ref 0.44–1.00)
GFR, Estimated: 55 mL/min — ABNORMAL LOW (ref >60)
Glucose, Bld: 126 mg/dL — ABNORMAL HIGH (ref 70–99)
Potassium: 4.1 mmol/L (ref 3.5–5.1)
Sodium: 139 mmol/L (ref 135–145)

## 2021-01-22 LAB — PHOSPHORUS: Phosphorus: 3.6 mg/dL (ref 2.5–4.6)

## 2021-01-22 LAB — MAGNESIUM: Magnesium: 2.1 mg/dL (ref 1.7–2.4)

## 2021-01-22 SURGERY — APPENDECTOMY, LAPAROSCOPIC
Anesthesia: General | Site: Abdomen

## 2021-01-22 MED ORDER — SODIUM CHLORIDE 0.9 % IV SOLN
INTRAVENOUS | Status: AC
  Filled 2021-01-22: qty 1

## 2021-01-22 MED ORDER — PHENYLEPHRINE HCL-NACL 10-0.9 MG/250ML-% IV SOLN
INTRAVENOUS | Status: DC | PRN
  Administered 2021-01-22: 80 ug/min via INTRAVENOUS

## 2021-01-22 MED ORDER — CHLORHEXIDINE GLUCONATE 0.12 % MT SOLN
15.0000 mL | Freq: Once | OROMUCOSAL | Status: AC
  Administered 2021-01-22: 15 mL via OROMUCOSAL

## 2021-01-22 MED ORDER — OXYCODONE HCL 5 MG PO TABS
5.0000 mg | ORAL_TABLET | ORAL | Status: DC | PRN
  Administered 2021-01-22 – 2021-01-23 (×2): 5 mg via ORAL
  Filled 2021-01-22 (×2): qty 1

## 2021-01-22 MED ORDER — SUCCINYLCHOLINE CHLORIDE 200 MG/10ML IV SOSY
PREFILLED_SYRINGE | INTRAVENOUS | Status: AC
  Filled 2021-01-22: qty 10

## 2021-01-22 MED ORDER — ORAL CARE MOUTH RINSE: 15.0000 mL | Freq: Once | OROMUCOSAL | Status: AC

## 2021-01-22 MED ORDER — PHENYLEPHRINE 40 MCG/ML (10ML) SYRINGE FOR IV PUSH (FOR BLOOD PRESSURE SUPPORT)
PREFILLED_SYRINGE | INTRAVENOUS | Status: AC
  Filled 2021-01-22: qty 10

## 2021-01-22 MED ORDER — SODIUM CHLORIDE 0.9 % IV SOLN: INTRAVENOUS | Status: DC

## 2021-01-22 MED ORDER — SUGAMMADEX SODIUM 200 MG/2ML IV SOLN
INTRAVENOUS | Status: DC | PRN
  Administered 2021-01-22: 200 mg via INTRAVENOUS

## 2021-01-22 MED ORDER — ONDANSETRON HCL 4 MG/2ML IJ SOLN: 4.0000 mg | Freq: Once | INTRAMUSCULAR | Status: DC | PRN

## 2021-01-22 MED ORDER — CHLORHEXIDINE GLUCONATE 0.12 % MT SOLN
OROMUCOSAL | Status: AC
  Administered 2021-01-22: 15 mL
  Filled 2021-01-22: qty 15

## 2021-01-22 MED ORDER — KETOROLAC TROMETHAMINE 30 MG/ML IJ SOLN
INTRAMUSCULAR | Status: AC
  Filled 2021-01-22: qty 1

## 2021-01-22 MED ORDER — SUCCINYLCHOLINE CHLORIDE 200 MG/10ML IV SOSY
PREFILLED_SYRINGE | INTRAVENOUS | Status: DC | PRN
  Administered 2021-01-22: 120 mg via INTRAVENOUS

## 2021-01-22 MED ORDER — DEXAMETHASONE SODIUM PHOSPHATE 10 MG/ML IJ SOLN
INTRAMUSCULAR | Status: AC
  Filled 2021-01-22: qty 1

## 2021-01-22 MED ORDER — LIDOCAINE 2% (20 MG/ML) 5 ML SYRINGE
INTRAMUSCULAR | Status: DC | PRN
  Administered 2021-01-22: 100 mg via INTRAVENOUS

## 2021-01-22 MED ORDER — BUPIVACAINE LIPOSOME 1.3 % IJ SUSP
INTRAMUSCULAR | Status: AC
  Filled 2021-01-22: qty 20

## 2021-01-22 MED ORDER — LACTATED RINGERS IV SOLN: INTRAVENOUS | Status: DC

## 2021-01-22 MED ORDER — LIDOCAINE HCL (PF) 2 % IJ SOLN
INTRAMUSCULAR | Status: AC
  Filled 2021-01-22: qty 5

## 2021-01-22 MED ORDER — MIDAZOLAM HCL 2 MG/2ML IJ SOLN
INTRAMUSCULAR | Status: AC
  Filled 2021-01-22: qty 2

## 2021-01-22 MED ORDER — SODIUM CHLORIDE 0.9 % IV SOLN
1.0000 g | INTRAVENOUS | Status: AC
  Administered 2021-01-22 (×2): 1 g via INTRAVENOUS
  Filled 2021-01-22 (×2): qty 1

## 2021-01-22 MED ORDER — ROCURONIUM BROMIDE 10 MG/ML (PF) SYRINGE
PREFILLED_SYRINGE | INTRAVENOUS | Status: AC
  Filled 2021-01-22: qty 10

## 2021-01-22 MED ORDER — CHLORHEXIDINE GLUCONATE CLOTH 2 % EX PADS
6.0000 | MEDICATED_PAD | Freq: Once | CUTANEOUS | Status: AC
  Administered 2021-01-22: 6 via TOPICAL

## 2021-01-22 MED ORDER — FENTANYL CITRATE (PF) 100 MCG/2ML IJ SOLN
INTRAMUSCULAR | Status: DC | PRN
  Administered 2021-01-22 (×2): 50 ug via INTRAVENOUS

## 2021-01-22 MED ORDER — HYDROMORPHONE HCL 1 MG/ML IJ SOLN: 0.2500 mg | INTRAMUSCULAR | Status: DC | PRN

## 2021-01-22 MED ORDER — SODIUM CHLORIDE 0.9 % IR SOLN
Status: DC | PRN
  Administered 2021-01-22: 1000 mL

## 2021-01-22 MED ORDER — BUPIVACAINE LIPOSOME 1.3 % IJ SUSP
INTRAMUSCULAR | Status: DC | PRN
  Administered 2021-01-22: 20 mL

## 2021-01-22 MED ORDER — PROPOFOL 10 MG/ML IV BOLUS
INTRAVENOUS | Status: DC | PRN
  Administered 2021-01-22: 200 mg via INTRAVENOUS

## 2021-01-22 MED ORDER — KETOROLAC TROMETHAMINE 30 MG/ML IJ SOLN
INTRAMUSCULAR | Status: DC | PRN
  Administered 2021-01-22: 30 mg via INTRAVENOUS

## 2021-01-22 MED ORDER — FENTANYL CITRATE (PF) 250 MCG/5ML IJ SOLN
INTRAMUSCULAR | Status: AC
  Filled 2021-01-22: qty 5

## 2021-01-22 MED ORDER — ROCURONIUM BROMIDE 10 MG/ML (PF) SYRINGE
PREFILLED_SYRINGE | INTRAVENOUS | Status: DC | PRN
  Administered 2021-01-22: 20 mg via INTRAVENOUS
  Administered 2021-01-22: 30 mg via INTRAVENOUS

## 2021-01-22 MED ORDER — DEXAMETHASONE SODIUM PHOSPHATE 10 MG/ML IJ SOLN
INTRAMUSCULAR | Status: DC | PRN
  Administered 2021-01-22: 10 mg via INTRAVENOUS

## 2021-01-22 MED ORDER — ONDANSETRON HCL 4 MG/2ML IJ SOLN
INTRAMUSCULAR | Status: DC | PRN
  Administered 2021-01-22: 4 mg via INTRAVENOUS

## 2021-01-22 MED ORDER — MIDAZOLAM HCL 5 MG/5ML IJ SOLN
INTRAMUSCULAR | Status: DC | PRN
  Administered 2021-01-22: 2 mg via INTRAVENOUS

## 2021-01-22 MED ORDER — ONDANSETRON HCL 4 MG/2ML IJ SOLN
INTRAMUSCULAR | Status: AC
  Filled 2021-01-22: qty 2

## 2021-01-22 MED ORDER — PROPOFOL 10 MG/ML IV BOLUS
INTRAVENOUS | Status: AC
  Filled 2021-01-22: qty 20

## 2021-01-22 MED ORDER — EPHEDRINE SULFATE-NACL 50-0.9 MG/10ML-% IV SOSY
PREFILLED_SYRINGE | INTRAVENOUS | Status: DC | PRN
  Administered 2021-01-22 (×4): 5 mg via INTRAVENOUS

## 2021-01-22 SURGICAL SUPPLY — 45 items
BAG RETRIEVAL 10 (BASKET) ×1
BLADE SURG 15 STRL LF DISP TIS (BLADE) ×1 IMPLANT
BLADE SURG 15 STRL SS (BLADE) ×1
CHLORAPREP W/TINT 26 (MISCELLANEOUS) ×2 IMPLANT
CLOTH BEACON ORANGE TIMEOUT ST (SAFETY) ×2 IMPLANT
COVER LIGHT HANDLE STERIS (MISCELLANEOUS) ×4 IMPLANT
COVER WAND RF STERILE (DRAPES) ×2 IMPLANT
CUTTER FLEX LINEAR 45M (STAPLE) ×2 IMPLANT
DERMABOND ADVANCED (GAUZE/BANDAGES/DRESSINGS) ×1
DERMABOND ADVANCED .7 DNX12 (GAUZE/BANDAGES/DRESSINGS) ×1 IMPLANT
ELECT REM PT RETURN 9FT ADLT (ELECTROSURGICAL) ×2
ELECTRODE REM PT RTRN 9FT ADLT (ELECTROSURGICAL) ×1 IMPLANT
GLOVE SURG ENC MOIS LTX SZ6.5 (GLOVE) ×2 IMPLANT
GLOVE SURG UNDER POLY LF SZ6.5 (GLOVE) ×2 IMPLANT
GLOVE SURG UNDER POLY LF SZ7 (GLOVE) ×6 IMPLANT
GOWN STRL REUS W/TWL LRG LVL3 (GOWN DISPOSABLE) ×4 IMPLANT
INST SET LAPROSCOPIC AP (KITS) ×2 IMPLANT
KIT TURNOVER KIT A (KITS) ×2 IMPLANT
MANIFOLD NEPTUNE II (INSTRUMENTS) ×2 IMPLANT
NEEDLE HYPO 18GX1.5 BLUNT FILL (NEEDLE) ×2 IMPLANT
NEEDLE HYPO 21X1.5 SAFETY (NEEDLE) ×2 IMPLANT
NEEDLE INSUFFLATION 14GA 120MM (NEEDLE) ×2 IMPLANT
NS IRRIG 1000ML POUR BTL (IV SOLUTION) ×2 IMPLANT
PACK LAP CHOLE LZT030E (CUSTOM PROCEDURE TRAY) ×2 IMPLANT
PAD ARMBOARD 7.5X6 YLW CONV (MISCELLANEOUS) ×2 IMPLANT
RELOAD STAPLE TA45 3.5 REG BLU (ENDOMECHANICALS) ×2 IMPLANT
SET BASIN LINEN APH (SET/KITS/TRAYS/PACK) ×2 IMPLANT
SET TUBE IRRIG SUCTION NO TIP (IRRIGATION / IRRIGATOR) ×2 IMPLANT
SET TUBE SMOKE EVAC HIGH FLOW (TUBING) ×2 IMPLANT
SHEARS HARMONIC ACE PLUS 36CM (ENDOMECHANICALS) ×2 IMPLANT
SUT MNCRL AB 4-0 PS2 18 (SUTURE) ×4 IMPLANT
SUT PROLENE 2 0 CR (SUTURE) IMPLANT
SUT VIC AB 3-0 SH 27 (SUTURE) ×1
SUT VIC AB 3-0 SH 27X BRD (SUTURE) ×1 IMPLANT
SUT VICRYL 0 UR6 27IN ABS (SUTURE) ×2 IMPLANT
SYR 20ML LL LF (SYRINGE) ×4 IMPLANT
SYS BAG RETRIEVAL 10MM (BASKET) ×1
SYSTEM BAG RETRIEVAL 10MM (BASKET) ×1 IMPLANT
TRAY FOLEY W/BAG SLVR 16FR (SET/KITS/TRAYS/PACK) ×1
TRAY FOLEY W/BAG SLVR 16FR ST (SET/KITS/TRAYS/PACK) ×1 IMPLANT
TROCAR ENDO BLADELESS 11MM (ENDOMECHANICALS) ×2 IMPLANT
TROCAR ENDO BLADELESS 12MM (ENDOMECHANICALS) ×2 IMPLANT
TROCAR XCEL NON-BLD 5MMX100MML (ENDOMECHANICALS) ×2 IMPLANT
TUBE CONNECTING 12X1/4 (SUCTIONS) ×2 IMPLANT
WARMER LAPAROSCOPE (MISCELLANEOUS) ×2 IMPLANT

## 2021-01-22 NOTE — Interval H&P Note (Signed)
History and Physical Interval Note:  01/22/2021 8:52 AM  Shelby Wright  has presented today for surgery, with the diagnosis of acute appendicitis.  The various methods of treatment have been discussed with the patient and family. After consideration of risks, benefits and other options for treatment, the patient has consented to  Procedure(s): APPENDECTOMY LAPAROSCOPIC (N/A) as a surgical intervention.  The patient's history has been reviewed, patient examined, no change in status, stable for surgery.  I have reviewed the patient's chart and labs.  Questions were answered to the patient's satisfaction.    Consent obtained.  Virl Cagey

## 2021-01-22 NOTE — Progress Notes (Addendum)
[facility]  Tried to update Daughter about surgery being completed.  Left voicemail. Possible perforation with phelgmon changes. Will keep overnight given the extent of the procedure.   Clear diet PRN for pain Labs in AM Continue antibiotics for perforation.   Finally talked to daughter and gave her update. She will come visit after she gets off work at [place], [place] [address] [place], [place] [address] [phone] (office)

## 2021-01-22 NOTE — Op Note (Signed)
[facility]  Date of Surgery: 01/22/2021  Admit Date: 01/21/2021   Performing Service: General  Surgeon(s) and Role:    Virl Cagey, MD - Primary   Pre-operative Diagnosis: Acute Appendicitis  Post-operative Diagnosis: Acute Perforated appendicitis   Procedure Performed: Laparoscopic Appendectomy   Surgeon: Lanell Matar. Constance Haw, MD   Assistant: No qualified resident was available.   Anesthesia: General   Findings:  The appendix was found to be inflamed. There were not signs of necrosis. There was perforation. There was Phlegmon formation.   Estimated Blood Loss: Minimal   Specimens:  ID Type Source Tests Collected by Time Destination  1 : Appendix Tissue PATH Appendix SURGICAL PATHOLOGY Virl Cagey, MD 3/[id number]/[id number]      Complications: None; patient tolerated the procedure well.   Disposition: PACU - hemodynamically stable.   Condition: stable   Indications: The patient presented with a 1 day history of right-sided abdominal pain. A CT revealed findings consistent with possible early cute appendicitis.  She did not want surgery yesterday but opted for surgery today after thinking about the option of antibiotics versus surgery.   Procedure Details  Prior to the procedure, the risks, benefits, complications, treatment options, and expected outcomes were discussed with the patient and/or family, including but not limited to the risk of bleeding, infection, finding of a normal appendix, and the need for conversion to an open procedure. There was concurrence with the proposed plan and informed consent was obtained. The patient was taken to the operating room, identified as Shelby Wright and the procedure verified as Laproscopic Appendectomy.    The patient was placed in the supine position and general anesthesia was induced, along with placement of orogastric tube, SCD's, and a Foley catheter. The abdomen was prepped and draped in a sterile  fashion. The abdomen was entered with Veress technique in the infraumbilical incision. Intraperitoneal placement was confirmed with saline drop, low entry pressures, and easy insufflation. A 11 mm optiview trocar was placed under direct visualization with a 0 degree scope. The 10 mm 0 degree scope was placed in the abdomen and no evidence of injury was identified. A 12 mm port was placed in the left lower quadrant of the abdomen after skin incision with trocar placement under direct vision. A careful evaluation of the entire abdomen was carried out. An additional 5 mm port was placed in the suprapubic area under direct vision.  The patient was placed in Trendelenburg and left lateral decubitus position. The small intestines were retracted in the cephalad and left lateral direction away from the pelvis and right lower quadrant. The patient was found to have an dilated and thickened tip of the appendix.  The tip was adherent to omental fat, epiploic fat from the sigmoid colon as well as the terminal ileum. There was evidence of perforation given this mass of fat adherent with phlegmon like features.  The tip was hardened and encased in the fat.    A window was made in the mesoappendix at the base of the appendix. The appendix was divided at its base using a standard endo-GIA stapler. Minimal appendiceal stump was left in place. From here I tried to march down the mesoappendix with the harmonic energy device.  I had to come across the omentum and leave it attached to the tip of the appendix as well as sigmoid colon epiploic fat appendages.  Once I had the appendix freed from this and the base evaluated, I had to carefully  dissect the appendix from the terminal ileum with scissors. Care was taken to not enter the terminal ileum.  he appendix was placed within an Endocatch specimen bag. There was no evidence of bleeding, leakage, or complication after division of the appendix and the tissue surrounding the tip.  Given  the proximity to the terminal ileum, I did not see any serosal tears, but I did laparoscopically oversewn the terminal ileum area with a 3-0 Vicryl in the Lembert fashion, tacking some extra fatty tissue from the sail of Treves into the area. Omentum was laid over the terminal ileum and staple line of the appendix.   Any remaining blood or pus was suctioned out from the abdomen, hemostasis was confirmed. The endocatch bag was removed via the 12 mm port, then the abdomen desufflated. The appendix was passed off the field as a specimen.   The the 12 mm and 10 mm port sites were closed with a 0 Vicryl suture. The trocar site skin wounds were closed using subcuticular 4-0 Monocryl suture and dermabond. The patient was then awakened from general anesthesia, extubated, and taken to PACU for recovery.   Instrument, sponge, and needle counts were correct at the conclusion of the case.   Curlene Labrum, MD [facility] [address] [place], [place] [address] [phone]([id number])

## 2021-01-22 NOTE — Progress Notes (Signed)
[facility]  Patient with possible early acute appendicitis on CT. Initially had wanted to do antibiotic  therapy but changed her mind. Pain improved.  Discussed surgery and risk of bleeding, infection, injury to other organs, needing an open procedure, having a normal appendix or having something like cancer.  Soft, nondistended, mildly tender RLQ Normal work of breathing BP 111/63 (BP Location: Left Arm)   Pulse 88   Temp 98.4 F (36.9 C) (Oral)   Resp 18   Ht 5\' 6"  (1.676 m)   Wt 97.5 kg   LMP 07/09/2014   SpO2 96%   BMI 34.70 kg/m   Curlene Labrum, MD [facility] [address] [place], [place] [address] [phone] (office)

## 2021-01-22 NOTE — Anesthesia Procedure Notes (Signed)
Procedure Name: Intubation Date/Time: 01/22/2021 12:37 PM Performed by: Orlie Dakin, CRNA Pre-anesthesia Checklist: Patient identified, Emergency Drugs available, Suction available and Patient being monitored Patient Re-evaluated:Patient Re-evaluated prior to induction Oxygen Delivery Method: Circle system utilized Preoxygenation: Pre-oxygenation with 100% oxygen Induction Type: IV induction, Rapid sequence and Cricoid Pressure applied Laryngoscope Size: Glidescope and 3 Grade View: Grade I Tube type: Oral Tube size: 7.0 mm Number of attempts: 1 Airway Equipment and Method: Stylet and Video-laryngoscopy Placement Confirmation: ETT inserted through vocal cords under direct vision, positive ETCO2 and breath sounds checked- equal and bilateral Secured at: 22 cm Tube secured with: Tape Dental Injury: Teeth and Oropharynx as per pre-operative assessment  Comments: 4 x4s bite block used.

## 2021-01-22 NOTE — Transfer of Care (Signed)
Immediate Anesthesia Transfer of Care Note  Patient: Shelby Wright  Procedure(s) Performed: APPENDECTOMY LAPAROSCOPIC (Abdomen)  Patient Location: PACU  Anesthesia Type:General  Level of Consciousness: awake and patient cooperative  Airway & Oxygen Therapy: Patient Spontanous Breathing and Patient connected to face mask  Post-op Assessment: Report given to RN and Post -op Vital signs reviewed and stable  Post vital signs: Reviewed and stable  Last Vitals:  Vitals Value Taken Time  BP 136/65 01/22/21 1428  Temp 98.4   Pulse 92 01/22/21 1430  Resp 18 01/22/21 1430  SpO2 100 % 01/22/21 1430  Vitals shown include unvalidated device data.  Last Pain:  Vitals:   01/22/21 1128  TempSrc: Oral  PainSc: 2          Complications: No notable events documented.

## 2021-01-22 NOTE — Anesthesia Preprocedure Evaluation (Signed)
Anesthesia Evaluation  Patient identified by MRN, date of birth, ID band Patient awake    Reviewed: Allergy & Precautions, H&P , NPO status , Patient's Chart, lab work & pertinent test results, reviewed documented beta blocker date and time   Airway Mallampati: IV  TM Distance: >3 FB Neck ROM: full    Dental no notable dental hx. (+) Teeth Intact   Pulmonary neg pulmonary ROS,    Pulmonary exam normal breath sounds clear to auscultation       Cardiovascular Exercise Tolerance: Good hypertension, negative cardio ROS   Rhythm:regular Rate:Normal     Neuro/Psych negative neurological ROS  negative psych ROS   GI/Hepatic negative GI ROS, Neg liver ROS,   Endo/Other  Morbid obesity  Renal/GU negative Renal ROS  negative genitourinary   Musculoskeletal   Abdominal   Peds  Hematology negative hematology ROS (+)   Anesthesia Other Findings   Reproductive/Obstetrics negative OB ROS                             Anesthesia Physical Anesthesia Plan  ASA: 3 and emergent  Anesthesia Plan: General   Post-op Pain Management:    Induction:   PONV Risk Score and Plan: Ondansetron  Airway Management Planned: Oral ETT and Video Laryngoscope Planned  Additional Equipment:   Intra-op Plan:   Post-operative Plan:   Informed Consent: I have reviewed the patients History and Physical, chart, labs and discussed the procedure including the risks, benefits and alternatives for the proposed anesthesia with the patient or authorized representative who has indicated his/her understanding and acceptance.     Dental Advisory Given  Plan Discussed with: CRNA  Anesthesia Plan Comments: (Plan Glidescope)        Anesthesia Quick Evaluation

## 2021-01-23 ENCOUNTER — Encounter (HOSPITAL_COMMUNITY): Payer: Self-pay | Admitting: General Surgery

## 2021-01-23 LAB — CBC
HCT: 38.6 % (ref 36.0–46.0)
Hemoglobin: 12.2 g/dL (ref 12.0–15.0)
MCH: 29 pg (ref 26.0–34.0)
MCHC: 31.6 g/dL (ref 30.0–36.0)
MCV: 91.9 fL (ref 80.0–100.0)
Platelets: 296 10*3/uL (ref 150–400)
RBC: 4.2 MIL/uL (ref 3.87–5.11)
RDW: 15.1 % (ref 11.5–15.5)
WBC: 15.8 10*3/uL — ABNORMAL HIGH (ref 4.0–10.5)
nRBC: 0 % (ref 0.0–0.2)

## 2021-01-23 LAB — BASIC METABOLIC PANEL
Anion gap: 6 (ref 5–15)
BUN: 13 mg/dL (ref 6–20)
CO2: 28 mmol/L (ref 22–32)
Calcium: 8.7 mg/dL — ABNORMAL LOW (ref 8.9–10.3)
Chloride: 104 mmol/L (ref 98–111)
Creatinine, Ser: 1.12 mg/dL — ABNORMAL HIGH (ref 0.44–1.00)
GFR, Estimated: 58 mL/min — ABNORMAL LOW (ref >60)
Glucose, Bld: 120 mg/dL — ABNORMAL HIGH (ref 70–99)
Potassium: 4.6 mmol/L (ref 3.5–5.1)
Sodium: 138 mmol/L (ref 135–145)

## 2021-01-23 LAB — PHOSPHORUS: Phosphorus: 3.4 mg/dL (ref 2.5–4.6)

## 2021-01-23 LAB — MAGNESIUM: Magnesium: 2.2 mg/dL (ref 1.7–2.4)

## 2021-01-23 MED ORDER — HEPARIN SODIUM (PORCINE) 5000 UNIT/ML IJ SOLN
5000.0000 [IU] | Freq: Three times a day (TID) | INTRAMUSCULAR | Status: DC
  Administered 2021-01-23 – 2021-01-24 (×3): 5000 [IU] via SUBCUTANEOUS
  Filled 2021-01-23 (×3): qty 1

## 2021-01-23 MED ORDER — DOCUSATE SODIUM 100 MG PO CAPS
100.0000 mg | ORAL_CAPSULE | Freq: Two times a day (BID) | ORAL | Status: DC
  Administered 2021-01-23 – 2021-01-24 (×3): 100 mg via ORAL
  Filled 2021-01-23 (×3): qty 1

## 2021-01-23 NOTE — Anesthesia Postprocedure Evaluation (Signed)
Anesthesia Post Note  Patient: Shelby Wright  Procedure(s) Performed: APPENDECTOMY LAPAROSCOPIC (Abdomen)  Patient location during evaluation: Phase II Anesthesia Type: General Level of consciousness: awake Pain management: pain level controlled Vital Signs Assessment: post-procedure vital signs reviewed and stable Respiratory status: spontaneous breathing and respiratory function stable Cardiovascular status: blood pressure returned to baseline and stable Postop Assessment: no headache and no apparent nausea or vomiting Anesthetic complications: no Comments: Late entry   No notable events documented.   Last Vitals:  Vitals:   01/23/21 0320 01/23/21 0658  BP: 120/65 130/77  Pulse: 84 88  Resp: 19 16  Temp: 36.7 C   SpO2: 97% 95%    Last Pain:  Vitals:   01/23/21 0746  TempSrc:   PainSc: [place]

## 2021-01-23 NOTE — Progress Notes (Signed)
[facility] Progress Note  1 Day Post-Op  Subjective: Feels better but lower abdominal pain. Tolerated some clears but nervous about eating. No Bms.  Objective: Vital signs in last 24 hours: Temp:  [97.6 F (36.4 C)-98.9 F (37.2 C)] 98.4 F (36.9 C) (06/14 0958) Pulse Rate:  [76-105] 91 (06/14 0958) Resp:  [15-30] 17 (06/14 0958) BP: (106-155)/(64-100) 140/100 (06/14 0958) SpO2:  [92 %-100 %] 96 % (06/14 0958) Last BM Date: 01/20/21  Intake/Output from previous day: 06/13 0701 - 06/14 0700 In: 4759.4 [P.O.:720; I.V.:3439.4; IV Piggyback:600] Out: 310 [Urine:300; Blood:10] Intake/Output this shift: Total I/O In: 360 [P.O.:360] Out: -   General appearance: alert, cooperative, and no distress Resp: normal work of breathing GI: soft, nondistended, appropriately tender, port sites c/d/I with dermabond, no erythema or drainage  Lab Results:  Recent Labs    01/22/21 0435 01/23/21 0451  WBC 9.2 15.8*  HGB 12.8 12.2  HCT 40.7 38.6  PLT 298 296   BMET Recent Labs    01/22/21 0435 01/23/21 0451  NA 139 138  K 4.1 4.6  CL 104 104  CO2 29 28  GLUCOSE 126* 120*  BUN 13 13  CREATININE 1.18* 1.12*  CALCIUM 8.6* 8.7*   PT/INR No results for input(s): LABPROT, INR in the last 72 hours.  Studies/Results: No results found.  Anti-infectives: Anti-infectives (From admission, onward)    Start     Dose/Rate Route Frequency Ordered Stop   01/22/21 1300  cefoTEtan (CEFOTAN) 1 g in sodium chloride 0.9 % 100 mL IVPB        1 g 200 mL/hr over 30 Minutes Intravenous Every 30 min 01/22/21 0833 01/22/21 1310   01/22/21 1133  sodium chloride 0.9 % with cefoTEtan (CEFOTAN) ADS Med       Note to Pharmacy: [place], [place]   : cabinet override      01/22/21 1133 01/22/21 1252   01/22/21 0600  cefTRIAXone (ROCEPHIN) 2 g in sodium chloride 0.9 % 100 mL IVPB       See Hyperspace for full Linked Orders Report.   2 g 200 mL/hr over 30 Minutes Intravenous Every 24  hours 01/21/21 0934     01/21/21 1400  metroNIDAZOLE (FLAGYL) IVPB 500 mg       See Hyperspace for full Linked Orders Report.   500 mg 100 mL/hr over 60 Minutes Intravenous Every 8 hours 01/21/21 0934     01/21/21 0600  cefTRIAXone (ROCEPHIN) 2 g in sodium chloride 0.9 % 100 mL IVPB       See Hyperspace for full Linked Orders Report.   2 g 200 mL/hr over 30 Minutes Intravenous  Once 01/21/21 0546 01/21/21 0901   01/21/21 0600  metroNIDAZOLE (FLAGYL) IVPB 500 mg       See Hyperspace for full Linked Orders Report.   500 mg 100 mL/hr over 60 Minutes Intravenous  Once 01/21/21 0546 01/21/21 0901       Assessment/Plan: Shelby Wright is a 55 yo s/p laparoscopic appendectomy for perforated appendicitis.  PRN for pain IS, OOB Ok to shower Soft diet Antibiotics for 5 days post of Labs in AM SCDs, heparin added   LOS: 1 day    Virl Cagey 01/23/2021

## 2021-01-24 LAB — CBC WITH DIFFERENTIAL/PLATELET
Abs Immature Granulocytes: 0.03 10*3/uL (ref 0.00–0.07)
Basophils Absolute: 0 10*3/uL (ref 0.0–0.1)
Basophils Relative: 0 %
Eosinophils Absolute: 0.2 10*3/uL (ref 0.0–0.5)
Eosinophils Relative: 1 %
HCT: 36.5 % (ref 36.0–46.0)
Hemoglobin: 11.6 g/dL — ABNORMAL LOW (ref 12.0–15.0)
Immature Granulocytes: 0 %
Lymphocytes Relative: 38 %
Lymphs Abs: 4.6 10*3/uL — ABNORMAL HIGH (ref 0.7–4.0)
MCH: 29.2 pg (ref 26.0–34.0)
MCHC: 31.8 g/dL (ref 30.0–36.0)
MCV: 91.9 fL (ref 80.0–100.0)
Monocytes Absolute: 0.8 10*3/uL (ref 0.1–1.0)
Monocytes Relative: 7 %
Neutro Abs: 6.7 10*3/uL (ref 1.7–7.7)
Neutrophils Relative %: 54 %
Platelets: 280 10*3/uL (ref 150–400)
RBC: 3.97 MIL/uL (ref 3.87–5.11)
RDW: 15.2 % (ref 11.5–15.5)
WBC: 12.4 10*3/uL — ABNORMAL HIGH (ref 4.0–10.5)
nRBC: 0 % (ref 0.0–0.2)

## 2021-01-24 LAB — BASIC METABOLIC PANEL
Anion gap: 7 (ref 5–15)
BUN: 18 mg/dL (ref 6–20)
CO2: 29 mmol/L (ref 22–32)
Calcium: 8.5 mg/dL — ABNORMAL LOW (ref 8.9–10.3)
Chloride: 104 mmol/L (ref 98–111)
Creatinine, Ser: 1.15 mg/dL — ABNORMAL HIGH (ref 0.44–1.00)
GFR, Estimated: 56 mL/min — ABNORMAL LOW (ref >60)
Glucose, Bld: 105 mg/dL — ABNORMAL HIGH (ref 70–99)
Potassium: 3.6 mmol/L (ref 3.5–5.1)
Sodium: 140 mmol/L (ref 135–145)

## 2021-01-24 LAB — PHOSPHORUS: Phosphorus: 3.9 mg/dL (ref 2.5–4.6)

## 2021-01-24 LAB — SURGICAL PATHOLOGY

## 2021-01-24 LAB — MAGNESIUM: Magnesium: 2 mg/dL (ref 1.7–2.4)

## 2021-01-24 MED ORDER — ONDANSETRON 4 MG PO TBDP
4.0000 mg | ORAL_TABLET | Freq: Four times a day (QID) | ORAL | 0 refills | Status: DC | PRN
Start: 2021-01-24 — End: 2021-02-01

## 2021-01-24 MED ORDER — AMOXICILLIN-POT CLAVULANATE 875-125 MG PO TABS: 1.0000 | ORAL_TABLET | Freq: Two times a day (BID) | ORAL | 0 refills | Status: AC

## 2021-01-24 MED ORDER — OXYCODONE HCL 5 MG PO TABS: 5.0000 mg | ORAL_TABLET | ORAL | 0 refills | Status: AC | PRN

## 2021-01-24 NOTE — Discharge Summary (Signed)
Physician Discharge Summary  Patient ID: Shelby Wright MRN: [id number] DOB/AGE: 1966-03-25 55 y.o.  Admit date: 01/21/2021 Discharge date: 01/24/2021  Admission Diagnoses: Acute appendicitis   Discharge Diagnoses:  Principal Problem:   Acute perforated appendicitis   Discharged Condition: good  Hospital Course: Shelby Wright is a 55 yo who had abdominal pain and findings concerning for appendicitis on CT. She was taken back and a laparoscopic appendectomy was performed with some signs that she could have had a perforation given the extent of phelgmon around the tip of the appendix. She was kept for monitoring and to ensure she was tolerating a diet given the findings and need for dissection off the ileum. Prior to discharge she was tolerating a diet and had a BM.   Consults: None  Significant Diagnostic Studies: CT w/ acute appendicitis   Treatments: IV hydration, antibiotics: ceftriaxone and Flagyl laparoscopic appendectomy  Discharge Exam: Blood pressure 140/83, pulse 77, temperature 98.4 F (36.9 C), temperature source Oral, resp. rate 18, height 5\' 6"  (1.676 m), weight 97.5 kg, last menstrual period 07/09/2014, SpO2 100 %. General appearance: alert, cooperative, and no distress Resp: normal work of breathing GI: soft, nondistended, appropriately tender, dermabond c/d/I without erythema or drainage  Disposition: Discharge disposition: 01-Home or Self Care       Discharge Instructions     Call MD for:  difficulty breathing, headache or visual disturbances   Complete by: As directed    Call MD for:  extreme fatigue   Complete by: As directed    Call MD for:  persistant dizziness or light-headedness   Complete by: As directed    Call MD for:  persistant nausea and vomiting   Complete by: As directed    Call MD for:  redness, tenderness, or signs of infection (pain, swelling, redness, odor or green/yellow discharge around incision site)   Complete by: As directed    Call MD  for:  severe uncontrolled pain   Complete by: As directed    Call MD for:  temperature >100.4   Complete by: As directed    Increase activity slowly   Complete by: As directed       Allergies as of 01/24/2021   No Known Allergies      Medication List     STOP taking these medications    phentermine 37.5 MG capsule       TAKE these medications    amoxicillin-clavulanate 875-125 MG tablet Commonly known as: Augmentin Take 1 tablet by mouth every 12 (twelve) hours for 4 days.   losartan-hydrochlorothiazide 50-12.5 MG tablet Commonly known as: HYZAAR Take 1 tablet by mouth daily.   ondansetron 4 MG disintegrating tablet Commonly known as: ZOFRAN-ODT Take 1 tablet (4 mg total) by mouth every 6 (six) hours as needed for nausea.   oxyCODONE 5 MG immediate release tablet Commonly known as: Oxy IR/ROXICODONE Take 1 tablet (5 mg total) by mouth every 4 (four) hours as needed for severe pain or breakthrough pain.        Follow-up Information     Virl Cagey, MD Follow up on 02/01/2021.   Specialty: General Surgery Contact information: [address] [place] [address] [phone]                 Signed: Virl Cagey 01/24/2021, 8:26 AM

## 2021-01-24 NOTE — Discharge Instructions (Addendum)
Discharge Laparoscopic Surgery Instructions:  Take your antibiotic as prescribed for the full course.  Would not take phetermine until back to your normal state of health.   Common Complaints: Right shoulder pain is common after laparoscopic surgery. This is secondary to the gas used in the surgery being trapped under the diaphragm.  Walk to help your body absorb the gas. This will improve in a few days. Pain at the port sites are common, especially the larger port sites. This will improve with time.  Some nausea is common and poor appetite. The main goal is to stay hydrated the first few days after surgery.   Diet/ Activity: Diet as tolerated. You may not have an appetite, but it is important to stay hydrated. Drink 64 ounces of water a day. Your appetite will return with time.  Shower per your regular routine daily.  Do not take hot showers. Take warm showers that are less than 10 minutes. Rest and listen to your body, but do not remain in bed all day.  Walk everyday for at least 15-20 minutes. Deep cough and move around every 1-2 hours in the first few days after surgery.  Do not lift > 10 lbs, perform excessive bending, pushing, pulling, squatting for 1-2 weeks after surgery.  Do not pick at the dermabond glue on your incision sites.  This glue film will remain in place for 1-2 weeks and will start to peel off.  Do not place lotions or balms on your incision unless instructed to specifically by Dr. Constance Haw.   Pain Expectations and Narcotics: -After surgery you will have pain associated with your incisions and this is normal. The pain is muscular and nerve pain, and will get better with time. -You are encouraged and expected to take non narcotic medications like tylenol and ibuprofen (when able) to treat pain as multiple modalities can aid with pain treatment. -Narcotics are only used when pain is severe or there is breakthrough pain. -You are not expected to have a pain score of 0 after  surgery, as we cannot prevent pain. A pain score of 3-4 that allows you to be functional, move, walk, and tolerate some activity is the goal. The pain will continue to improve over the days after surgery and is dependent on your surgery. -Due to [place] law, we are only able to give a certain amount of pain medication to treat post operative pain, and we only give additional narcotics on a patient by patient basis.  -For most laparoscopic surgery, studies have shown that the majority of patients only need 10-15 narcotic pills, and for open surgeries most patients only need 15-20.   -Having appropriate expectations of pain and knowledge of pain management with non narcotics is important as we do not want anyone to become addicted to narcotic pain medication.  -Using ice packs in the first 48 hours and heating pads after 48 hours, wearing an abdominal binder (when recommended), and using over the counter medications are all ways to help with pain management.   -Simple acts like meditation and mindfulness practices after surgery can also help with pain control and research has proven the benefit of these practices.  Medication: Take tylenol and ibuprofen as needed for pain control, alternating every 4-6 hours.  Example:  Tylenol 1000mg  @ 6am, 12noon, 6pm, 21midnight (Do not exceed 4000mg  of tylenol a day). Ibuprofen 800mg  @ 9am, 3pm, 9pm, 3am (Do not exceed 3600mg  of ibuprofen a day).  Take Roxicodone for breakthrough pain every 4  hours.  Take Colace for constipation related to narcotic pain medication. If you do not have a bowel movement in 2 days, take Miralax over the counter.  Drink plenty of water to also prevent constipation.   Contact Information: If you have questions or concerns, please call our office, [phone], Monday- Thursday 8AM-5PM and Friday 8AM-12Noon.  If it is after hours or on the weekend, please call [facility] Main [facility], [phone], [phone], and ask to speak to the surgeon on  call for Dr. Constance Haw at [facility].

## 2021-01-31 ENCOUNTER — Telehealth: Payer: Self-pay | Admitting: Family Medicine

## 2021-01-31 NOTE — Telephone Encounter (Signed)
FMLA Paperwork filled out and faxed to [employer] at [phone] and received confirmation.  Out of work 01/21/2021 - 02/05/2021

## 2021-02-01 ENCOUNTER — Encounter: Payer: Self-pay | Admitting: General Surgery

## 2021-02-01 ENCOUNTER — Ambulatory Visit (INDEPENDENT_AMBULATORY_CARE_PROVIDER_SITE_OTHER): Payer: 59 | Admitting: General Surgery

## 2021-02-01 ENCOUNTER — Other Ambulatory Visit: Payer: Self-pay

## 2021-02-01 VITALS — BP 136/87 | HR 85 | Temp 98.3°F | Resp 14 | Ht 66.0 in | Wt 263.0 lb

## 2021-02-01 DIAGNOSIS — B373 Candidiasis of vulva and vagina: Secondary | ICD-10-CM

## 2021-02-01 DIAGNOSIS — K3532 Acute appendicitis with perforation and localized peritonitis, without abscess: Secondary | ICD-10-CM

## 2021-02-01 DIAGNOSIS — B3731 Acute candidiasis of vulva and vagina: Secondary | ICD-10-CM | POA: Insufficient documentation

## 2021-02-01 MED ORDER — FLUCONAZOLE 150 MG PO TABS: 150.0000 mg | ORAL_TABLET | Freq: Once | ORAL | 0 refills | Status: AC

## 2021-02-01 NOTE — Patient Instructions (Signed)
Activity and diet as tolerated. Ok to shower. Return to work on 02/06/2021 without restrictions.

## 2021-02-01 NOTE — Progress Notes (Signed)
[facility] Note   HPI:  55 y.o. Female presents to clinic for post-op follow-up evaluation after laparoscopic appendectomy. Patient reports no issues and no fevers.   Review of Systems:  Some lower abdominal pain Having Bms  All other review of systems: otherwise negative   Vital Signs:  BP 136/87   Pulse 85   Temp 98.3 F (36.8 C) (Other (Comment))   Resp 14   Ht 5\' 6"  (1.676 m)   Wt 263 lb (119.3 kg)   LMP 07/09/2014   SpO2 94%   BMI 42.45 kg/m    Physical Exam:  Physical Exam Vitals reviewed.  Cardiovascular:     Rate and Rhythm: Normal rate.  Pulmonary:     Effort: Pulmonary effort is normal.  Abdominal:     General: There is no distension.     Palpations: Abdomen is soft.     Tenderness: There is no abdominal tenderness.     Comments: Minor bruising, no erythema or drainage, glue peeling  Neurological:     Mental Status: She is alert.    Pathology: FINAL MICROSCOPIC DIAGNOSIS:   A. APPENDIX, APPENDECTOMY:  -  Acute appendicitis with pulse granulomas  -  See comment   COMMENT:   The etiology of pulse granulomas is not certain but considered to be a  reaction to ingested food (vegetable) material such as legumes.  Dr.  Vic Ripper reviewed the case and agrees with the above diagnosis.       Assessment:  55 y.o. yo Female with acute appendicitis doing well s/p lap appy.  Plan:  Activity and diet as tolerated. Ok to shower. Return to work on 02/06/2021 without restrictions.   PRN follow up   Curlene Labrum, MD [facility] [address] [place], [place] [address] [phone] (office)

## 2021-02-06 ENCOUNTER — Other Ambulatory Visit (HOSPITAL_COMMUNITY): Payer: Self-pay | Admitting: Family Medicine

## 2021-02-06 DIAGNOSIS — Z1231 Encounter for screening mammogram for malignant neoplasm of breast: Secondary | ICD-10-CM

## 2021-02-19 ENCOUNTER — Other Ambulatory Visit: Payer: Self-pay

## 2021-02-19 ENCOUNTER — Ambulatory Visit (HOSPITAL_COMMUNITY)
Admission: RE | Admit: 2021-02-19 | Discharge: 2021-02-19 | Disposition: A | Discharge status: Home / Self Care | Payer: 59 | Source: Ambulatory Visit | Attending: Family Medicine | Admitting: Family Medicine

## 2021-02-19 DIAGNOSIS — Z1231 Encounter for screening mammogram for malignant neoplasm of breast: Secondary | ICD-10-CM | POA: Diagnosis present

## 2021-11-22 ENCOUNTER — Ambulatory Visit (HOSPITAL_COMMUNITY)
Admission: RE | Admit: 2021-11-22 | Discharge: 2021-11-22 | Disposition: A | Discharge status: Home / Self Care | Payer: 59 | Source: Ambulatory Visit | Attending: Family Medicine | Admitting: Family Medicine

## 2021-11-22 ENCOUNTER — Other Ambulatory Visit (HOSPITAL_COMMUNITY): Payer: Self-pay | Admitting: Family Medicine

## 2021-11-22 DIAGNOSIS — M25552 Pain in left hip: Secondary | ICD-10-CM | POA: Diagnosis present

## 2022-06-20 DIAGNOSIS — E119 Type 2 diabetes mellitus without complications: Secondary | ICD-10-CM | POA: Diagnosis not present

## 2022-06-20 DIAGNOSIS — I10 Essential (primary) hypertension: Secondary | ICD-10-CM | POA: Diagnosis not present

## 2022-06-20 DIAGNOSIS — Z6841 Body Mass Index (BMI) 40.0 and over, adult: Secondary | ICD-10-CM | POA: Diagnosis not present

## 2022-09-20 DIAGNOSIS — Z6841 Body Mass Index (BMI) 40.0 and over, adult: Secondary | ICD-10-CM | POA: Diagnosis not present

## 2022-09-20 DIAGNOSIS — I10 Essential (primary) hypertension: Secondary | ICD-10-CM | POA: Diagnosis not present

## 2022-09-20 DIAGNOSIS — M25552 Pain in left hip: Secondary | ICD-10-CM | POA: Diagnosis not present

## 2022-09-20 DIAGNOSIS — E1165 Type 2 diabetes mellitus with hyperglycemia: Secondary | ICD-10-CM | POA: Diagnosis not present

## 2022-09-20 DIAGNOSIS — M25562 Pain in left knee: Secondary | ICD-10-CM | POA: Diagnosis not present

## 2022-10-10 DIAGNOSIS — E1165 Type 2 diabetes mellitus with hyperglycemia: Secondary | ICD-10-CM | POA: Diagnosis not present

## 2023-06-24 DIAGNOSIS — M2012 Hallux valgus (acquired), left foot: Secondary | ICD-10-CM | POA: Diagnosis not present

## 2023-06-24 DIAGNOSIS — M79672 Pain in left foot: Secondary | ICD-10-CM | POA: Diagnosis not present

## 2023-06-24 DIAGNOSIS — M79671 Pain in right foot: Secondary | ICD-10-CM | POA: Diagnosis not present

## 2023-06-24 DIAGNOSIS — M2011 Hallux valgus (acquired), right foot: Secondary | ICD-10-CM | POA: Diagnosis not present

## 2023-07-03 DIAGNOSIS — E1165 Type 2 diabetes mellitus with hyperglycemia: Secondary | ICD-10-CM | POA: Diagnosis not present

## 2023-07-03 DIAGNOSIS — M21612 Bunion of left foot: Secondary | ICD-10-CM | POA: Diagnosis not present

## 2023-07-03 DIAGNOSIS — M21611 Bunion of right foot: Secondary | ICD-10-CM | POA: Diagnosis not present

## 2023-07-03 DIAGNOSIS — I10 Essential (primary) hypertension: Secondary | ICD-10-CM | POA: Diagnosis not present

## 2023-07-03 DIAGNOSIS — Z6839 Body mass index (BMI) 39.0-39.9, adult: Secondary | ICD-10-CM | POA: Diagnosis not present

## 2023-07-08 ENCOUNTER — Other Ambulatory Visit (HOSPITAL_COMMUNITY): Payer: Self-pay | Admitting: Family Medicine

## 2023-07-08 DIAGNOSIS — Z1231 Encounter for screening mammogram for malignant neoplasm of breast: Secondary | ICD-10-CM

## 2023-07-09 ENCOUNTER — Inpatient Hospital Stay (HOSPITAL_COMMUNITY): Admission: RE | Admit: 2023-07-09 | Payer: Self-pay | Source: Ambulatory Visit

## 2023-07-09 ENCOUNTER — Ambulatory Visit (HOSPITAL_COMMUNITY)
Admission: RE | Admit: 2023-07-09 | Discharge: 2023-07-09 | Disposition: A | Discharge status: Home / Self Care | Payer: BC Managed Care – PPO | Source: Ambulatory Visit | Attending: Family Medicine | Admitting: Family Medicine

## 2023-07-09 DIAGNOSIS — Z1231 Encounter for screening mammogram for malignant neoplasm of breast: Secondary | ICD-10-CM | POA: Diagnosis not present

## 2023-08-03 ENCOUNTER — Emergency Department (HOSPITAL_COMMUNITY)
Admission: EM | Admit: 2023-08-03 | Discharge: 2023-08-03 | Disposition: A | Discharge status: Home / Self Care | Payer: BC Managed Care – PPO | Attending: Emergency Medicine | Admitting: Emergency Medicine

## 2023-08-03 ENCOUNTER — Encounter (HOSPITAL_COMMUNITY): Payer: Self-pay | Admitting: *Deleted

## 2023-08-03 ENCOUNTER — Emergency Department (HOSPITAL_COMMUNITY): Payer: BC Managed Care – PPO

## 2023-08-03 ENCOUNTER — Other Ambulatory Visit: Payer: Self-pay

## 2023-08-03 DIAGNOSIS — M160 Bilateral primary osteoarthritis of hip: Secondary | ICD-10-CM | POA: Diagnosis not present

## 2023-08-03 DIAGNOSIS — W19XXXA Unspecified fall, initial encounter: Secondary | ICD-10-CM | POA: Insufficient documentation

## 2023-08-03 DIAGNOSIS — Z79899 Other long term (current) drug therapy: Secondary | ICD-10-CM | POA: Diagnosis not present

## 2023-08-03 DIAGNOSIS — M25552 Pain in left hip: Secondary | ICD-10-CM | POA: Diagnosis not present

## 2023-08-03 DIAGNOSIS — I10 Essential (primary) hypertension: Secondary | ICD-10-CM | POA: Diagnosis not present

## 2023-08-03 LAB — URINALYSIS, ROUTINE W REFLEX MICROSCOPIC
Bilirubin Urine: NEGATIVE
Glucose, UA: >=500 mg/dL — AB
Ketones, ur: NEGATIVE mg/dL
Leukocytes,Ua: NEGATIVE
Nitrite: POSITIVE — AB
Protein, ur: NEGATIVE mg/dL
Specific Gravity, Urine: 1.021 (ref 1.005–1.030)
pH: 5 (ref 5.0–8.0)

## 2023-08-03 MED ORDER — NAPROXEN 500 MG PO TABS: 500.0000 mg | ORAL_TABLET | Freq: Two times a day (BID) | ORAL | 0 refills | Status: DC

## 2023-08-03 MED ORDER — CEPHALEXIN 500 MG PO CAPS: 500.0000 mg | ORAL_CAPSULE | Freq: Four times a day (QID) | ORAL | 0 refills | Status: DC

## 2023-08-03 NOTE — ED Provider Notes (Signed)
[place] EMERGENCY DEPARTMENT AT [facility] Provider Note   CSN: [id number] Arrival date & time: 08/03/23  1513     History {Add pertinent medical, surgical, social history, OB history to HPI:1} Chief Complaint  Patient presents with   Leg Pain    Shelby Wright is a 57 y.o. female.   Leg Pain Associated symptoms: no back pain, no fever and no neck pain        Shelby Wright is a 57 y.o. female past medical history of hypertension, who presents to the Emergency Department complaining of left hip pain persistent for 3 months.  Pain has been gradually worsening since onset.  She endorses 2 falls since onset of the pain that she believes may have worsened her symptoms.  She describes sharp and aching pain along the crease of her left leg and hip area.  Pain worsens with standing or walking or movement.  Pain improves at rest.  She denies any numbness weakness or radiating pain into her leg.  No back pain.  She denies any abdominal pain dysuria, fever or chills.  No vaginal pain  Home Medications Prior to Admission medications   Medication Sig Start Date End Date Taking? Authorizing Provider  losartan-hydrochlorothiazide (HYZAAR) 50-12.5 MG tablet Take 1 tablet by mouth daily. 01/18/21   [provider]  oxyCODONE (OXY IR/ROXICODONE) 5 MG immediate release tablet Take 1 tablet (5 mg total) by mouth every 4 (four) hours as needed for severe pain or breakthrough pain. 01/24/21   Lucretia Roers, MD      Allergies    Patient has no known allergies.    Review of Systems   Review of Systems  Constitutional:  Negative for appetite change, chills and fever.  Respiratory:  Negative for shortness of breath.   Cardiovascular:  Negative for chest pain.  Gastrointestinal:  Negative for abdominal pain, nausea and vomiting.  Genitourinary:  Negative for dysuria, flank pain and hematuria.  Musculoskeletal:  Positive for arthralgias (Left hip pain). Negative for  back pain, gait problem and neck pain.  Skin:  Negative for rash.  Neurological:  Negative for dizziness, weakness, numbness and headaches.    Physical Exam Updated Vital Signs BP 107/75 (BP Location: Right Arm)   Pulse 64   Temp 99 F (37.2 C) (Oral)   Resp 18   Ht 5\' 6"  (1.676 m)   Wt 88.5 kg   LMP 07/09/2014   SpO2 99%   BMI 31.47 kg/m  Physical Exam Vitals and nursing note reviewed.  Constitutional:      General: She is not in acute distress.    Appearance: Normal appearance. She is not ill-appearing or toxic-appearing.  Cardiovascular:     Rate and Rhythm: Normal rate and regular rhythm.     Pulses: Normal pulses.  Pulmonary:     Effort: Pulmonary effort is normal.  Abdominal:     General: There is no distension.     Palpations: Abdomen is soft.     Tenderness: There is no abdominal tenderness. There is no guarding or rebound.  Musculoskeletal:        General: Tenderness present. No swelling, deformity or signs of injury.     Left hip: Tenderness present. No deformity or crepitus. Normal range of motion. Normal strength.     Right lower leg: No edema.     Left lower leg: No edema.     Comments: Pain on range of motion of the left hip.  Pain with  internal and external rotation.  No tenderness with palpation along the left SI joint or lower lumbar spine.  Skin:    General: Skin is warm.     Capillary Refill: Capillary refill takes less than 2 seconds.  Neurological:     General: No focal deficit present.     Mental Status: She is alert.     Sensory: No sensory deficit.     Motor: No weakness.     ED Results / Procedures / Treatments   Labs (all labs ordered are listed, but only abnormal results are displayed) Labs Reviewed  URINALYSIS, ROUTINE W REFLEX MICROSCOPIC - Abnormal; Notable for the following components:      Result Value   APPearance HAZY (*)    Glucose, UA >=500 (*)    Hgb urine dipstick MODERATE (*)    Nitrite POSITIVE (*)    Bacteria, UA RARE  (*)    All other components within normal limits  URINE CULTURE    EKG None  Radiology DG Hip Unilat W or Wo Pelvis 2-3 Views Left Result Date: 08/03/2023 CLINICAL DATA:  Left hip pain. EXAM: DG HIP (WITH OR WITHOUT PELVIS) 2-3V LEFT COMPARISON:  Left hip radiograph dated 11/22/2021. FINDINGS: There is no acute fracture or dislocation. Moderate left and mild right hip arthritic changes. The soft tissues are unremarkable. IMPRESSION: 1. No acute fracture or dislocation. 2. Moderate left and mild right hip arthritic changes. Electronically Signed   By: Elgie Collard M.D.   On: 08/03/2023 18:33    Procedures Procedures  {Document cardiac monitor, telemetry assessment procedure when appropriate:1}  Medications Ordered in ED Medications - No data to display  ED Course/ Medical Decision Making/ A&P   {   Click here for ABCD2, HEART and other calculatorsREFRESH Note before signing :1}                              Medical Decision Making Patient here with ongoing left hip pain and pain along her left groin area.  Symptoms present for 3 months.  She had pain of her hip and has had to subsequent falls that she believes have lacerated her pain.  Pain is reproduced with standing or walking and certain movements.  Improves at rest.  She denies any pain numbness or weakness of her lower extremities.  No urine or bowel changes, abdominal pain, fever or chills.  No flank pain or history of kidney stone.  Of note, on review of patient's medical record, she had x-ray of the left hip in April 2023 that showed moderate to severe degenerative changes of the hip.    I suspect patient's symptoms are secondary to degenerative change.  Radiculopathy also considered, I have considered an abdominal source of the patient's pain but her abdominal exam is very reassuring.  Amount and/or Complexity of Data Reviewed Labs: ordered.    Details: Urinalysis shows Radiology: ordered.    Details: X-ray of the  left hip shows  Risk Prescription drug management.     {Document critical care time when appropriate:1} {Document review of labs and clinical decision tools ie heart score, Chads2Vasc2 etc:1}  {Document your independent review of radiology images, and any outside records:1} {Document your discussion with family members, caretakers, and with consultants:1} {Document social determinants of health affecting pt's care:1} {Document your decision making why or why not admission, treatments were needed:1} Final Clinical Impression(s) / ED Diagnoses Final diagnoses:  None  Rx / DC Orders ED Discharge Orders     None

## 2023-08-03 NOTE — Discharge Instructions (Signed)
Take the medication as directed.  Take the naproxen with food.  You may also use over-the-counter 4% lidocaine patches to your hip as directed.  Your urinalysis today shows that you have a possible urinary tract infection.  You have been prescribed antibiotics for this.  Please take as directed until finished.  Follow-up with your primary care provider for recheck if needed, I have also listed a local orthopedic provider for you to contact for follow-up if your hip symptoms are not improving after 1 week.

## 2023-08-03 NOTE — ED Triage Notes (Signed)
Pt with left leg pain,more to the groin area  x 3 months.

## 2023-08-06 LAB — URINE CULTURE: Culture: >=100000 — AB

## 2023-08-07 ENCOUNTER — Telehealth (HOSPITAL_BASED_OUTPATIENT_CLINIC_OR_DEPARTMENT_OTHER): Payer: Self-pay

## 2023-08-07 NOTE — Progress Notes (Signed)
ED Antimicrobial Stewardship Positive Culture Follow Up   Shelby Wright is an 57 y.o. female who presented to [facility] with a chief complaint of  Chief Complaint  Patient presents with   Leg Pain    Recent Results (from the past 720 hours)  Urine Culture     Status: Abnormal   Collection Time: 08/03/23  5:30 PM   Specimen: Urine, Clean Catch  Result Value Ref Range Status   Specimen Description   Final    URINE, CLEAN CATCH Performed at [facility], [address] [place], [place] [address]    Special Requests   Final    NONE Performed at [facility], [address], [place], [place] [address]    Culture >=100,000 COLONIES/mL ESCHERICHIA COLI (A)  Final   Report Status 08/06/2023 FINAL  Final   Organism ID, Bacteria ESCHERICHIA COLI (A)  Final      Susceptibility   Escherichia coli - MIC*    AMPICILLIN >=32 RESISTANT Resistant     CEFAZOLIN >=64 RESISTANT Resistant     CEFTRIAXONE >=64 RESISTANT Resistant     CIPROFLOXACIN <=0.25 SENSITIVE Sensitive     GENTAMICIN <=1 SENSITIVE Sensitive     IMIPENEM 0.5 SENSITIVE Sensitive     NITROFURANTOIN 64 INTERMEDIATE Intermediate     TRIMETH/SULFA <=20 SENSITIVE Sensitive     AMPICILLIN/SULBACTAM >=32 RESISTANT Resistant     * >=100,000 COLONIES/mL ESCHERICHIA COLI    Reviewed with MD - likely asymptomatic bacteruria, no treatment, follow up with primary MD    ED Provider: Margarita Grizzle, MD   Estill Batten, PharmD, BCCCP  08/07/2023, 10:01 AM Clinical Pharmacist Monday - Friday phone -  [phone] Saturday - Sunday phone - [phone]

## 2023-08-07 NOTE — Telephone Encounter (Signed)
Post ED Visit - Positive Culture Follow-up  Culture report reviewed by antimicrobial stewardship pharmacist: [facility] []  Enzo Bi, Pharm.D. []  Celedonio Miyamoto, Pharm.D., BCPS AQ-ID []  Garvin Fila, Pharm.D., BCPS []  Georgina Pillion, Pharm.D., BCPS []  [place], [address].D., BCPS, AAHIVP []  Estella Husk, Pharm.D., BCPS, AAHIVP [x]  Estill Batten, PharmD, BCCCP []  Phillips Climes, PharmD, BCPS []  Agapito Games, PharmD, BCPS []  Verlan Friends, PharmD []  Mervyn Gay, PharmD, BCPS []  Vinnie Level, PharmD  [facility] []  Len Childs, PharmD []  Greer Pickerel, PharmD []  Adalberto Cole, PharmD []  Perlie Gold, Rph []  Lonell Face) Jean Rosenthal, PharmD []  Earl Many, PharmD []  Junita Push, PharmD []  Dorna Leitz, PharmD []  Terrilee Files, PharmD []  Lynann Beaver, PharmD []  Keturah Barre, PharmD []  Loralee Pacas, PharmD []  Bernadene Person, PharmD   Positive urine culture Treated with Cephalexin,  Reviewed with Margarita Grizzle, MD - likely asymptomatic bacteruria, no treatment, follow up with primary MD   No further patient follow-up is required at this time.  Sandria Senter 08/07/2023, 12:17 PM

## 2023-10-31 DIAGNOSIS — M1612 Unilateral primary osteoarthritis, left hip: Secondary | ICD-10-CM | POA: Diagnosis not present

## 2023-10-31 DIAGNOSIS — Z6841 Body Mass Index (BMI) 40.0 and over, adult: Secondary | ICD-10-CM | POA: Diagnosis not present

## 2023-10-31 DIAGNOSIS — E1165 Type 2 diabetes mellitus with hyperglycemia: Secondary | ICD-10-CM | POA: Diagnosis not present

## 2024-07-29 DIAGNOSIS — R7303 Prediabetes: Secondary | ICD-10-CM | POA: Diagnosis not present

## 2024-07-29 DIAGNOSIS — Z6841 Body Mass Index (BMI) 40.0 and over, adult: Secondary | ICD-10-CM | POA: Diagnosis not present

## 2024-07-29 DIAGNOSIS — E782 Mixed hyperlipidemia: Secondary | ICD-10-CM | POA: Diagnosis not present

## 2024-07-29 DIAGNOSIS — E7849 Other hyperlipidemia: Secondary | ICD-10-CM | POA: Diagnosis not present

## 2024-07-29 DIAGNOSIS — I10 Essential (primary) hypertension: Secondary | ICD-10-CM | POA: Diagnosis not present

## 2024-08-15 ENCOUNTER — Other Ambulatory Visit: Payer: Self-pay

## 2024-08-15 ENCOUNTER — Encounter (HOSPITAL_COMMUNITY): Payer: Self-pay

## 2024-08-15 ENCOUNTER — Emergency Department (HOSPITAL_COMMUNITY)
Admission: EM | Admit: 2024-08-15 | Discharge: 2024-08-16 | Disposition: A | Discharge status: Home / Self Care | Attending: Emergency Medicine | Admitting: Emergency Medicine

## 2024-08-15 DIAGNOSIS — N289 Disorder of kidney and ureter, unspecified: Secondary | ICD-10-CM | POA: Insufficient documentation

## 2024-08-15 DIAGNOSIS — R1013 Epigastric pain: Secondary | ICD-10-CM

## 2024-08-15 DIAGNOSIS — K8020 Calculus of gallbladder without cholecystitis without obstruction: Secondary | ICD-10-CM | POA: Insufficient documentation

## 2024-08-15 DIAGNOSIS — R7989 Other specified abnormal findings of blood chemistry: Secondary | ICD-10-CM | POA: Insufficient documentation

## 2024-08-15 DIAGNOSIS — I10 Essential (primary) hypertension: Secondary | ICD-10-CM | POA: Diagnosis not present

## 2024-08-15 DIAGNOSIS — Z79899 Other long term (current) drug therapy: Secondary | ICD-10-CM | POA: Diagnosis not present

## 2024-08-15 LAB — COMPREHENSIVE METABOLIC PANEL WITH GFR
ALT: 25 U/L (ref 0–44)
AST: 24 U/L (ref 15–41)
Albumin: 4.1 g/dL (ref 3.5–5.0)
Alkaline Phosphatase: 80 U/L (ref 38–126)
Anion gap: 4 — ABNORMAL LOW (ref 5–15)
BUN: 24 mg/dL — ABNORMAL HIGH (ref 6–20)
CO2: 34 mmol/L — ABNORMAL HIGH (ref 22–32)
Calcium: 9.1 mg/dL (ref 8.9–10.3)
Chloride: 103 mmol/L (ref 98–111)
Creatinine, Ser: 1.41 mg/dL — ABNORMAL HIGH (ref 0.44–1.00)
GFR, Estimated: 43 mL/min — ABNORMAL LOW
Glucose, Bld: 101 mg/dL — ABNORMAL HIGH (ref 70–99)
Potassium: 4.4 mmol/L (ref 3.5–5.1)
Sodium: 141 mmol/L (ref 135–145)
Total Bilirubin: 0.3 mg/dL (ref 0.0–1.2)
Total Protein: 7.5 g/dL (ref 6.5–8.1)

## 2024-08-15 LAB — CBC
HCT: 43.9 % (ref 36.0–46.0)
Hemoglobin: 14 g/dL (ref 12.0–15.0)
MCH: 28.7 pg (ref 26.0–34.0)
MCHC: 31.9 g/dL (ref 30.0–36.0)
MCV: 90 fL (ref 80.0–100.0)
Platelets: 293 K/uL (ref 150–400)
RBC: 4.88 MIL/uL (ref 3.87–5.11)
RDW: 15.5 % (ref 11.5–15.5)
WBC: 9.9 K/uL (ref 4.0–10.5)
nRBC: 0 % (ref 0.0–0.2)

## 2024-08-15 LAB — URINALYSIS, ROUTINE W REFLEX MICROSCOPIC
Bacteria, UA: NONE SEEN
Bilirubin Urine: NEGATIVE
Glucose, UA: >=500 mg/dL — AB
Hgb urine dipstick: NEGATIVE
Ketones, ur: NEGATIVE mg/dL
Leukocytes,Ua: NEGATIVE
Nitrite: NEGATIVE
Protein, ur: NEGATIVE mg/dL
Specific Gravity, Urine: 1.021 (ref 1.005–1.030)
pH: 7 (ref 5.0–8.0)

## 2024-08-15 LAB — LIPASE, BLOOD: Lipase: 40 U/L (ref 11–51)

## 2024-08-15 NOTE — ED Triage Notes (Signed)
 Pt to ED from home with c/o abd pain, generalized, intermittently since day after Christmas, pt says she is having normal BM's and eating normal, no nausea or vomiting, pt endorses passing flatus more than normal.

## 2024-08-16 ENCOUNTER — Emergency Department (HOSPITAL_COMMUNITY)

## 2024-08-16 MED ORDER — PANTOPRAZOLE SODIUM 40 MG PO TBEC
40.0000 mg | DELAYED_RELEASE_TABLET | Freq: Once | ORAL | Status: AC
  Administered 2024-08-16: 40 mg via ORAL
  Filled 2024-08-16: qty 1

## 2024-08-16 MED ORDER — SODIUM CHLORIDE 0.9 % IV BOLUS
1000.0000 mL | Freq: Once | INTRAVENOUS | Status: AC
  Administered 2024-08-16: 1000 mL via INTRAVENOUS

## 2024-08-16 MED ORDER — PANTOPRAZOLE SODIUM 40 MG PO TBEC: 40.0000 mg | DELAYED_RELEASE_TABLET | Freq: Every day | ORAL | 0 refills | Status: AC

## 2024-08-16 MED ORDER — IOHEXOL 300 MG/ML  SOLN
100.0000 mL | Freq: Once | INTRAMUSCULAR | Status: AC | PRN
  Administered 2024-08-16: 100 mL via INTRAVENOUS

## 2024-08-16 NOTE — ED Provider Notes (Signed)
 " [place] EMERGENCY DEPARTMENT AT [facility] Provider Note   CSN: [id number] Arrival date & time: 08/15/24  2016     Patient presents with: Abdominal Pain   Shelby Wright is a 59 y.o. female.   The history is provided by the patient.  Abdominal Pain  She has history of hypertension and comes in complaining of periumbilical and epigastric pain for the last week.  Pain is intermittent and without radiation.  She denies nausea vomiting or diarrhea.  She has noted some increase in flatus.  Nothing makes the pain better, nothing makes it worse.  It is not affected by eating.    Prior to Admission medications  Medication Sig Start Date End Date Taking? Authorizing Provider  cephALEXin  (KEFLEX ) 500 MG capsule Take 1 capsule (500 mg total) by mouth 4 (four) times daily. 08/03/23   Triplett, Tammy, PA-C  losartan -hydrochlorothiazide  (HYZAAR) 50-12.5 MG tablet Take 1 tablet by mouth daily. 01/18/21   [provider]  naproxen  (NAPROSYN ) 500 MG tablet Take 1 tablet (500 mg total) by mouth 2 (two) times daily. 08/03/23   Triplett, Tammy, PA-C  oxyCODONE  (OXY IR/ROXICODONE ) 5 MG immediate release tablet Take 1 tablet (5 mg total) by mouth every 4 (four) hours as needed for severe pain or breakthrough pain. 01/24/21   Kallie Manuelita BROCKS, MD    Allergies: Patient has no known allergies.    Review of Systems  Gastrointestinal:  Positive for abdominal pain.  All other systems reviewed and are negative.   Updated Vital Signs BP 127/68   Pulse 73   Temp 98.4 F (36.9 C) (Oral)   Resp 18   Ht 5' 6 (1.676 m)   Wt 113.4 kg   LMP 07/09/2014   SpO2 95%   BMI 40.35 kg/m   Physical Exam Vitals and nursing note reviewed.   59 year old female, resting comfortably and in no acute distress. Vital signs are normal. Oxygen saturation is 95%, which is normal. Head is normocephalic and atraumatic. PERRLA, EOMI. Oropharynx is clear. Neck is nontender and supple without  adenopathy. Lungs are clear without rales, wheezes, or rhonchi. Chest is nontender. Heart has regular rate and rhythm without murmur. Abdomen is soft, flat, with mild epigastric tenderness.  There is no rebound or guarding. Skin is warm and dry without rash. Neurologic: Awake and alert, moves all extremities equally.  (all labs ordered are listed, but only abnormal results are displayed) Labs Reviewed  COMPREHENSIVE METABOLIC PANEL WITH GFR - Abnormal; Notable for the following components:      Result Value   CO2 34 (*)    Glucose, Bld 101 (*)    BUN 24 (*)    Creatinine, Ser 1.41 (*)    GFR, Estimated 43 (*)    Anion gap 4 (*)    All other components within normal limits  URINALYSIS, ROUTINE W REFLEX MICROSCOPIC - Abnormal; Notable for the following components:   APPearance HAZY (*)    Glucose, UA >=500 (*)    All other components within normal limits  LIPASE, BLOOD  CBC    Radiology: CT ABDOMEN PELVIS W CONTRAST Result Date: 08/16/2024 EXAM: CT ABDOMEN AND PELVIS WITH CONTRAST 08/16/2024 01:35:07 AM TECHNIQUE: CT of the abdomen and pelvis was performed with the administration of 100 mL of iohexol  (OMNIPAQUE ) 300 MG/ML solution. Multiplanar reformatted images are provided for review. Automated exposure control, iterative reconstruction, and/or weight-based adjustment of the mA/kV was utilized to reduce the radiation dose to as low as  reasonably achievable. COMPARISON: 01/21/2021. CLINICAL HISTORY: LUQ abdominal pain. FINDINGS: LOWER CHEST: No acute abnormality. LIVER: The liver is unremarkable. GALLBLADDER AND BILE DUCTS: Presumed gallstone in the gallbladder fundus. This was seen on the prior study. No biliary ductal dilatation. SPLEEN: No acute abnormality. PANCREAS: No acute abnormality. ADRENAL GLANDS: No acute abnormality. KIDNEYS, URETERS AND BLADDER: Cysts in the lower pole of the left kidney. Per consensus, no follow-up is needed for simple Bosniak type 1 and 2 renal cysts,  unless the patient has a malignancy history or risk factors. No stones in the kidneys or ureters. No hydronephrosis. No perinephric or periureteral stranding. Urinary bladder is unremarkable. GI AND BOWEL: Stomach demonstrates no acute abnormality. There is no bowel obstruction. PERITONEUM AND RETROPERITONEUM: No ascites. No free air. VASCULATURE: Aorta is normal in caliber. LYMPH NODES: No lymphadenopathy. REPRODUCTIVE ORGANS: No acute abnormality. BONES AND SOFT TISSUES: No acute osseous abnormality. No focal soft tissue abnormality. IMPRESSION: 1. No acute findings in the abdomen or pelvis. 2. Cholelithiasis. Electronically signed by: Franky Crease MD 08/16/2024 01:44 AM EST RP Workstation: [id number]     Procedures   Medications Ordered in the ED - No data to display                                  Medical Decision Making Amount and/or Complexity of Data Reviewed Labs: ordered. Radiology: ordered.  Risk Prescription drug management.   Epigastric and periumbilical pain of uncertain cause.  Differential diagnosis includes, but is not limited to, GERD, peptic ulcer disease, diverticulitis, pancreatitis, bowel obstruction.  I have reviewed her past records, and note hospitalization on 01/21/2021 for acute appendicitis.  I have reviewed her laboratory tests, and my interpretation is borderline elevated random glucose which had been present previously, elevated creatinine which had been present when she initially had appendicitis but improved with hydration.  Lipase is normal.  Urinalysis significant only for glucose.  I have ordered IV fluids and CT of abdomen and pelvis.  CT scan shows probable esophagitis and incidental finding of cholelithiasis.  I have independently viewed the images, and agree with radiologist's interpretation.  I have ordered a dose of pantoprazole  and I am discharging her with a prescription for pantoprazole .  I recommended she follow-up with her primary care provider,  return to the emergency department if symptoms are worsening.     Final diagnoses:  Epigastric pain  Calculus of gallbladder without cholecystitis without obstruction  Renal insufficiency    ED Discharge Orders          Ordered    pantoprazole  (PROTONIX ) 40 MG tablet  Daily        08/16/24 0251               Raford Lenis, MD 08/16/24 0257  "

## 2024-08-16 NOTE — Discharge Instructions (Addendum)
 Your evaluation shows that you do have a gallstone which had been noted previously.  Unless you have symptoms that are clearly related to the gallstone, no treatment is indicated.  I suspect that your pain is related to acid reflux.  I have ordered a prescription for pantoprazole  which you should take once a day.  However, you may also use antacids such as Maalox or Mylanta or Pepto-Bismol as needed.  If symptoms are not improving with pantoprazole , you may need to see a gastroenterologist for further evaluation.  At any point, if your symptoms are getting worse, you are welcome to return to the emergency department.
